# Patient Record
Sex: Male | Born: 1950 | Race: White | Hispanic: No | Marital: Married | Smoking: Former smoker
Health system: Southern US, Community
[De-identification: ages and names within clinical notes are randomized; demographics above are authoritative.]

## PROBLEM LIST (undated history)

## (undated) DIAGNOSIS — K7460 Unspecified cirrhosis of liver: Secondary | ICD-10-CM

## (undated) DIAGNOSIS — E119 Type 2 diabetes mellitus without complications: Secondary | ICD-10-CM

## (undated) DIAGNOSIS — I10 Essential (primary) hypertension: Secondary | ICD-10-CM

## (undated) DIAGNOSIS — M109 Gout, unspecified: Secondary | ICD-10-CM

## (undated) DIAGNOSIS — M5136 Other intervertebral disc degeneration, lumbar region: Secondary | ICD-10-CM

## (undated) DIAGNOSIS — M51369 Other intervertebral disc degeneration, lumbar region without mention of lumbar back pain or lower extremity pain: Secondary | ICD-10-CM

## (undated) HISTORY — PX: ADENOIDECTOMY: SUR15

## (undated) HISTORY — PX: ANKLE SURGERY: SHX546

## (undated) HISTORY — PX: CHOLECYSTECTOMY: SHX55

## (undated) HISTORY — PX: TONSILLECTOMY: SUR1361

---

## 2012-12-28 ENCOUNTER — Encounter (HOSPITAL_BASED_OUTPATIENT_CLINIC_OR_DEPARTMENT_OTHER): Payer: Self-pay | Admitting: *Deleted

## 2012-12-28 ENCOUNTER — Emergency Department (HOSPITAL_BASED_OUTPATIENT_CLINIC_OR_DEPARTMENT_OTHER)
Admission: EM | Admit: 2012-12-28 | Discharge: 2012-12-28 | Disposition: A | Discharge status: Home / Self Care | Payer: Medicare Other | Attending: Emergency Medicine | Admitting: Emergency Medicine

## 2012-12-28 ENCOUNTER — Emergency Department (HOSPITAL_BASED_OUTPATIENT_CLINIC_OR_DEPARTMENT_OTHER): Payer: Medicare Other

## 2012-12-28 DIAGNOSIS — Z794 Long term (current) use of insulin: Secondary | ICD-10-CM | POA: Insufficient documentation

## 2012-12-28 DIAGNOSIS — E119 Type 2 diabetes mellitus without complications: Secondary | ICD-10-CM | POA: Insufficient documentation

## 2012-12-28 DIAGNOSIS — M67919 Unspecified disorder of synovium and tendon, unspecified shoulder: Secondary | ICD-10-CM | POA: Insufficient documentation

## 2012-12-28 DIAGNOSIS — F17200 Nicotine dependence, unspecified, uncomplicated: Secondary | ICD-10-CM | POA: Insufficient documentation

## 2012-12-28 DIAGNOSIS — Z8639 Personal history of other endocrine, nutritional and metabolic disease: Secondary | ICD-10-CM | POA: Insufficient documentation

## 2012-12-28 DIAGNOSIS — M7542 Impingement syndrome of left shoulder: Secondary | ICD-10-CM

## 2012-12-28 DIAGNOSIS — Z79899 Other long term (current) drug therapy: Secondary | ICD-10-CM | POA: Insufficient documentation

## 2012-12-28 DIAGNOSIS — Z862 Personal history of diseases of the blood and blood-forming organs and certain disorders involving the immune mechanism: Secondary | ICD-10-CM | POA: Insufficient documentation

## 2012-12-28 DIAGNOSIS — M5137 Other intervertebral disc degeneration, lumbosacral region: Secondary | ICD-10-CM | POA: Insufficient documentation

## 2012-12-28 DIAGNOSIS — M51379 Other intervertebral disc degeneration, lumbosacral region without mention of lumbar back pain or lower extremity pain: Secondary | ICD-10-CM | POA: Insufficient documentation

## 2012-12-28 DIAGNOSIS — Z880 Allergy status to penicillin: Secondary | ICD-10-CM | POA: Insufficient documentation

## 2012-12-28 DIAGNOSIS — I10 Essential (primary) hypertension: Secondary | ICD-10-CM | POA: Insufficient documentation

## 2012-12-28 DIAGNOSIS — M719 Bursopathy, unspecified: Secondary | ICD-10-CM | POA: Insufficient documentation

## 2012-12-28 DIAGNOSIS — Z8719 Personal history of other diseases of the digestive system: Secondary | ICD-10-CM | POA: Insufficient documentation

## 2012-12-28 HISTORY — DX: Gout, unspecified: M10.9

## 2012-12-28 HISTORY — DX: Unspecified cirrhosis of liver: K74.60

## 2012-12-28 HISTORY — DX: Essential (primary) hypertension: I10

## 2012-12-28 HISTORY — DX: Other intervertebral disc degeneration, lumbar region without mention of lumbar back pain or lower extremity pain: M51.369

## 2012-12-28 HISTORY — DX: Other intervertebral disc degeneration, lumbar region: M51.36

## 2012-12-28 HISTORY — DX: Type 2 diabetes mellitus without complications: E11.9

## 2012-12-28 NOTE — ED Provider Notes (Signed)
History     CSN: [id number]  Arrival date & time 12/28/12  1425   First MD Initiated Contact with Patient 12/28/12 1426      Chief Complaint  Patient presents with  . Shoulder Pain    (Consider location/radiation/quality/duration/timing/severity/associated sxs/prior treatment) HPI Comments: Patient presents emergency department with chief complaint of left shoulder pain. He states that he has had pain similar to this in the past. He denies any known mechanism of injury. He states that he sleeps with his arm elevated above his head, and sometimes holds onto his headboard while sleeping. He is uncertain if he twisted or screen that in the night. He states that his pain is moderate to severe. He states that it hurts to raise his arm. He has not tried taking anything to alleviate his symptoms.  The history is provided by the patient. No language interpreter was used.    Past Medical History  Diagnosis Date  . Diabetes mellitus without complication   . Hypertension   . Gout   . Cirrhosis of liver not due to alcohol   . DDD (degenerative disc disease), lumbar     Past Surgical History  Procedure Laterality Date  . Ankle surgery    . Cholecystectomy    . Tonsillectomy    . Adenoidectomy      History reviewed. No pertinent family history.  History  Substance Use Topics  . Smoking status: Current Every Day Smoker  . Smokeless tobacco: Not on file  . Alcohol Use: No      Review of Systems  All other systems reviewed and are negative.    Allergies  Penicillins  Home Medications   Current Outpatient Rx  Name  Route  Sig  Dispense  Refill  . amLODipine (NORVASC) 10 MG tablet   Oral   Take 10 mg by mouth daily.         Marland Kitchen GABAPENTIN, PHN, PO   Oral   Take by mouth.         . insulin glargine (LANTUS) 100 UNIT/ML injection   Subcutaneous   Inject 35 Units into the skin at bedtime.         . Liraglutide (VICTOZA) 18 MG/3ML SOPN   Subcutaneous   Inject  into the skin.           BP 145/79  Pulse 82  Temp(Src) 98.2 F (36.8 C) (Oral)  Resp 20  Ht 6' (1.829 m)  Wt 246 lb (111.585 kg)  BMI 33.36 kg/m2  SpO2 96%  Physical Exam  Nursing note and vitals reviewed. Constitutional: He is oriented to person, place, and time. He appears well-developed and well-nourished.  HENT:  Head: Normocephalic and atraumatic.  Eyes: Conjunctivae and EOM are normal.  Neck: Normal range of motion.  Cardiovascular: Normal rate.   Pulmonary/Chest: Effort normal.  Abdominal: He exhibits no distension.  Musculoskeletal: Normal range of motion.  Left shoulder tender to palpation over the deltoid, no tenderness over the [place] or a.c. joint, no tenderness over the acromion, Hawkins-Kennedy impingement test is positive, empty can test is positive, patient unable to abduct his arm beyond 30. It is very painful with range of motion. Strength is deferred secondary to pain. No evidence of infection, no redness, no obvious deformity or abnormality.  Neurological: He is alert and oriented to person, place, and time.  Skin: Skin is dry.  Psychiatric: He has a normal mood and affect. His behavior is normal. Judgment and thought content normal.  ED Course  Procedures (including critical care time)  No results found for this or any previous visit. Dg Shoulder Left  12/28/2012  *RADIOLOGY REPORT*  Clinical Data: Shoulder pain  LEFT SHOULDER - 2+ VIEW  Comparison: None.  Findings: Three views of the left shoulder submitted.  Mild degenerative changes left AC joint.  No acute fracture or subluxation.  Small calcification is noted adjacent to humeral head suspicious for calcific tendonitis.  IMPRESSION: No acute fracture or subluxation.  Mild degenerative changes left AC joint.  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   Original Report Authenticated By: Lahoma Crocker, M.D.        1. Shoulder impingement, left       MDM  Patient with left shoulder pain. No known mechanism of  injury, however based on the patient's sleeping habits, I am suspicious of shoulder impingement syndrome, I will order a shoulder x-ray for further evaluation. Will reevaluate.  Shoulder films reveal degenerative changes of the a.c. joints and probable calcific tendinitis, will refer the patient to orthopedics.  Sling for relief of pain.  Patient can take his oxycodone at home for break through pain.  Patient is stable and ready for discharge.        Montine Circle, PA-C 12/28/12 1513

## 2012-12-28 NOTE — ED Notes (Signed)
Pt states he woke up Friday and his left shoulder was hurting. "I hang onto the headboard sometimes and this has happened before". PMS intact.

## 2012-12-29 NOTE — ED Provider Notes (Signed)
History/physical exam/procedure(s) were performed by non-physician practitioner and as supervising physician I was immediately available for consultation/collaboration. I have reviewed all notes and am in agreement with care and plan.   Shaune Pollack, MD 12/29/12 [phone]

## 2013-04-28 ENCOUNTER — Emergency Department (HOSPITAL_BASED_OUTPATIENT_CLINIC_OR_DEPARTMENT_OTHER)
Admission: EM | Admit: 2013-04-28 | Discharge: 2013-04-28 | Disposition: A | Discharge status: Home / Self Care | Payer: Medicare Other | Attending: Emergency Medicine | Admitting: Emergency Medicine

## 2013-04-28 ENCOUNTER — Emergency Department (HOSPITAL_BASED_OUTPATIENT_CLINIC_OR_DEPARTMENT_OTHER): Payer: Medicare Other

## 2013-04-28 ENCOUNTER — Encounter (HOSPITAL_BASED_OUTPATIENT_CLINIC_OR_DEPARTMENT_OTHER): Payer: Self-pay | Admitting: *Deleted

## 2013-04-28 DIAGNOSIS — Z8739 Personal history of other diseases of the musculoskeletal system and connective tissue: Secondary | ICD-10-CM | POA: Insufficient documentation

## 2013-04-28 DIAGNOSIS — F17200 Nicotine dependence, unspecified, uncomplicated: Secondary | ICD-10-CM | POA: Insufficient documentation

## 2013-04-28 DIAGNOSIS — M542 Cervicalgia: Secondary | ICD-10-CM | POA: Insufficient documentation

## 2013-04-28 DIAGNOSIS — E119 Type 2 diabetes mellitus without complications: Secondary | ICD-10-CM | POA: Insufficient documentation

## 2013-04-28 DIAGNOSIS — Z79899 Other long term (current) drug therapy: Secondary | ICD-10-CM | POA: Insufficient documentation

## 2013-04-28 DIAGNOSIS — Z880 Allergy status to penicillin: Secondary | ICD-10-CM | POA: Insufficient documentation

## 2013-04-28 DIAGNOSIS — I10 Essential (primary) hypertension: Secondary | ICD-10-CM | POA: Insufficient documentation

## 2013-04-28 DIAGNOSIS — Z794 Long term (current) use of insulin: Secondary | ICD-10-CM | POA: Insufficient documentation

## 2013-04-28 DIAGNOSIS — Z8719 Personal history of other diseases of the digestive system: Secondary | ICD-10-CM | POA: Insufficient documentation

## 2013-04-28 DIAGNOSIS — M109 Gout, unspecified: Secondary | ICD-10-CM | POA: Insufficient documentation

## 2013-04-28 MED ORDER — HYDROMORPHONE HCL PF 1 MG/ML IJ SOLN
1.0000 mg | Freq: Once | INTRAMUSCULAR | Status: AC
  Administered 2013-04-28: 1 mg via INTRAMUSCULAR
  Filled 2013-04-28: qty 1

## 2013-04-28 MED ORDER — CYCLOBENZAPRINE HCL 10 MG PO TABS: 5.0000 mg | ORAL_TABLET | Freq: Two times a day (BID) | ORAL | Status: DC | PRN

## 2013-04-28 MED ORDER — HYDROMORPHONE HCL PF 1 MG/ML IJ SOLN: 1.0000 mg | Freq: Once | INTRAMUSCULAR | Status: AC

## 2013-04-28 MED ORDER — OXYCODONE-ACETAMINOPHEN 5-325 MG PO TABS
1.0000 | ORAL_TABLET | Freq: Once | ORAL | Status: AC
  Administered 2013-04-28: 1 via ORAL
  Filled 2013-04-28 (×2): qty 1

## 2013-04-28 MED ORDER — HYDROMORPHONE HCL PF 1 MG/ML IJ SOLN
INTRAMUSCULAR | Status: AC
  Administered 2013-04-28: 1 mg via INTRAMUSCULAR
  Filled 2013-04-28: qty 1

## 2013-04-28 NOTE — ED Notes (Signed)
Pt to room 4 in w/c, able to stand and walk to bed. Pt reports neck pain since awakening Friday am, "I must have slept on it wrong..." pain is "stiff, throbbing" and has worsened since then.

## 2013-04-28 NOTE — ED Provider Notes (Signed)
CSN: [id number]     Arrival date & time 04/28/13  1013 History   First MD Initiated Contact with Patient 04/28/13 1032     Chief Complaint  Patient presents with  . Neck Pain   (Consider location/radiation/quality/duration/timing/severity/associated sxs/prior Treatment) Patient is a 62 y.o. male presenting with neck pain. The history is provided by the patient.  Neck Pain Pain location:  L side Quality:  Aching Pain radiates to:  Does not radiate Pain severity:  Moderate Pain is:  Same all the time Onset quality:  Gradual Duration:  4 days Timing:  Constant Progression:  Worsening Chronicity:  New Context: not fall and not recent injury   Relieved by:  Nothing Ineffective treatments:  Analgesics (percocet at home) Associated symptoms: no chest pain, no fever, no headaches, no numbness and no tingling     Past Medical History  Diagnosis Date  . Diabetes mellitus without complication   . Hypertension   . Gout   . Cirrhosis of liver not due to alcohol   . DDD (degenerative disc disease), lumbar    Past Surgical History  Procedure Laterality Date  . Ankle surgery    . Cholecystectomy    . Tonsillectomy    . Adenoidectomy     History reviewed. No pertinent family history. History  Substance Use Topics  . Smoking status: Current Every Day Smoker  . Smokeless tobacco: Not on file  . Alcohol Use: No    Review of Systems  Constitutional: Negative for fever.  HENT: Positive for neck pain. Negative for rhinorrhea and drooling.   Eyes: Negative for pain.  Respiratory: Negative for cough and shortness of breath.   Cardiovascular: Negative for chest pain and leg swelling.  Gastrointestinal: Negative for nausea, vomiting, abdominal pain and diarrhea.  Genitourinary: Negative for dysuria and hematuria.  Musculoskeletal: Negative for gait problem.  Skin: Negative for color change.  Neurological: Negative for tingling, numbness and headaches.  Hematological: Negative for  adenopathy.  Psychiatric/Behavioral: Negative for behavioral problems.  All other systems reviewed and are negative.    Allergies  Penicillins  Home Medications   Current Outpatient Rx  Name  Route  Sig  Dispense  Refill  . allopurinol (ZYLOPRIM) 100 MG tablet   Oral   Take 100 mg by mouth daily.         Marland Kitchen amLODipine (NORVASC) 10 MG tablet   Oral   Take 10 mg by mouth daily.         . fish oil-omega-3 fatty acids 1000 MG capsule   Oral   Take 2 g by mouth daily.         Marland Kitchen FLUoxetine (PROZAC) 20 MG capsule   Oral   Take 20 mg by mouth daily.         Marland Kitchen GABAPENTIN, PHN, PO   Oral   Take by mouth.         Marland Kitchen glipiZIDE (GLUCOTROL) 5 MG tablet   Oral   Take 5 mg by mouth.         . hydrochlorothiazide (HYDRODIURIL) 25 MG tablet   Oral   Take 25 mg by mouth daily.         . insulin glargine (LANTUS) 100 UNIT/ML injection   Subcutaneous   Inject 35 Units into the skin at bedtime.         . Liraglutide (VICTOZA) 18 MG/3ML SOPN   Subcutaneous   Inject into the skin.         . metFORMIN (  GLUCOPHAGE) 500 MG tablet   Oral   Take 500 mg by mouth 2 (two) times daily with a meal.         . metoCLOPramide (REGLAN) 5 MG tablet   Oral   Take 5 mg by mouth.         . Multiple Vitamin (MULTIVITAMIN) capsule   Oral   Take 1 capsule by mouth daily.         Marland Kitchen omeprazole (PRILOSEC) 20 MG capsule   Oral   Take 20 mg by mouth daily.         . solifenacin (VESICARE) 5 MG tablet   Oral   Take 5 mg by mouth daily.          BP 134/76  Pulse 84  Temp(Src) 97.7 F (36.5 C) (Oral)  Resp 18  Ht 6' (1.829 m)  Wt 246 lb (111.585 kg)  BMI 33.36 kg/m2  SpO2 97% Physical Exam  Nursing note and vitals reviewed. Constitutional: He is oriented to person, place, and time. He appears well-developed and well-nourished.  HENT:  Head: Normocephalic and atraumatic.  Right Ear: External ear normal.  Left Ear: External ear normal.  Nose: Nose normal.   Mouth/Throat: Oropharynx is clear and moist. No oropharyngeal exudate.  Eyes: Conjunctivae and EOM are normal. Pupils are equal, round, and reactive to light.  Neck: Normal range of motion. Neck supple.  Moderate ttp of left lateral upper neck. No midline cervical ttp.    Cardiovascular: Normal rate, regular rhythm, normal heart sounds and intact distal pulses.  Exam reveals no gallop and no friction rub.   No murmur heard. Pulmonary/Chest: Effort normal and breath sounds normal. No respiratory distress. He has no wheezes.  Abdominal: Soft. Bowel sounds are normal. He exhibits no distension. There is no tenderness. There is no rebound and no guarding.  Musculoskeletal: Normal range of motion. He exhibits no edema and no tenderness.  Neurological: He is alert and oriented to person, place, and time. He has normal strength. No sensory deficit. Coordination and gait normal.  Normal strength throughout. Normal sensation throughout.   Skin: Skin is warm and dry.  Psychiatric: He has a normal mood and affect. His behavior is normal.    ED Course  Procedures (including critical care time) Labs Review Labs Reviewed - No data to display Imaging Review Ct Cervical Spine Wo Contrast  04/28/2013   *RADIOLOGY REPORT*  Clinical Data: Lateral left neck pain.  CT CERVICAL SPINE WITHOUT CONTRAST  Technique:  Multidetector CT imaging of the cervical spine was performed. Multiplanar CT image reconstructions were also generated.  Comparison: None.  Findings: Vertebral body height is maintained.  There is trace anterolisthesis of C4 on C5, C5 on C6 and C6 on C7 due to facet degenerative change.  Intervertebral disc space height is maintained.  Failure fusion of the posterior arch of C1 is incidentally noted.  The patient does have a central protrusion at C2-3 which appears to contact the cord but the central canal appears open.  Intervertebral disc spaces are otherwise unremarkable by CT scan.  There is a polypoid  appearing lesion in the valleculae which could be a prominent lingual tonsil but cannot be definitively characterized.  Lung apices demonstrate some emphysematous change.  IMPRESSION:  1.  No acute finding or finding to explain the patient's symptoms. 2.  Polypoid appearing soft tissue focus in the vallecula may be a hypertrophied lingual tonsil but cannot be definitively characterized.  Consultation with ENT for direct  visualization is recommended. 3.  Facet arthropathy results in trace anterolisthesis from C4-C7. 4.  Central protrusion at C2-3 and may contact the cord but the central canal appears open. 5.  Emphysema.   Original Report Authenticated By: Orlean Patten, M.D.    MDM   1. Neck pain on left side    10:52 AM 62 y.o. male who presents with left lateral neck pain that was noted upon awakening 4 days ago. The patient notes the pain has progressively worsened since then. He denies any other associated symptoms including fever. He is afebrile and vital signs are stable here. No midline cervical tenderness. Suspect muscular cause but will get CT of neck to rule out occult fracture or jumped facets. Dilaudid and po Percocet for pain control.  12:39 PM: Pt feeling better on exam. Imaging non-contrib. I notified pt of incidental finding of soft tissue focus on the vallecula, will rec f/u w/ pcp for this. Pt has percocet at home, will provide muscle relaxant. I have discussed the diagnosis/risks/treatment options with the patient and believe the pt to be eligible for discharge home to follow-up with pcp this week. We also discussed returning to the ED immediately if new or worsening sx occur. We discussed the sx which are most concerning (e.g., worsening pain, weakness, numbness, fever) that necessitate immediate return. Any new prescriptions provided to the patient are listed below.  Discharge Medication List as of 04/28/2013 12:40 PM    START taking these medications   Details  cyclobenzaprine  (FLEXERIL) 10 MG tablet Take 0.5 tablets (5 mg total) by mouth 2 (two) times daily as needed for muscle spasms., Starting 04/28/2013, Until Discontinued, Print         Blanchard Kelch, MD 04/28/13 1444

## 2014-01-31 ENCOUNTER — Emergency Department (HOSPITAL_BASED_OUTPATIENT_CLINIC_OR_DEPARTMENT_OTHER): Payer: Medicare Other

## 2014-01-31 ENCOUNTER — Emergency Department (HOSPITAL_BASED_OUTPATIENT_CLINIC_OR_DEPARTMENT_OTHER)
Admission: EM | Admit: 2014-01-31 | Discharge: 2014-01-31 | Disposition: A | Discharge status: Home / Self Care | Payer: Medicare Other | Attending: Emergency Medicine | Admitting: Emergency Medicine

## 2014-01-31 ENCOUNTER — Encounter (HOSPITAL_BASED_OUTPATIENT_CLINIC_OR_DEPARTMENT_OTHER): Payer: Self-pay | Admitting: Emergency Medicine

## 2014-01-31 DIAGNOSIS — Z23 Encounter for immunization: Secondary | ICD-10-CM | POA: Insufficient documentation

## 2014-01-31 DIAGNOSIS — L02419 Cutaneous abscess of limb, unspecified: Secondary | ICD-10-CM | POA: Insufficient documentation

## 2014-01-31 DIAGNOSIS — L03119 Cellulitis of unspecified part of limb: Secondary | ICD-10-CM

## 2014-01-31 DIAGNOSIS — L0390 Cellulitis, unspecified: Secondary | ICD-10-CM

## 2014-01-31 DIAGNOSIS — L089 Local infection of the skin and subcutaneous tissue, unspecified: Secondary | ICD-10-CM

## 2014-01-31 DIAGNOSIS — F17200 Nicotine dependence, unspecified, uncomplicated: Secondary | ICD-10-CM | POA: Insufficient documentation

## 2014-01-31 DIAGNOSIS — E11628 Type 2 diabetes mellitus with other skin complications: Secondary | ICD-10-CM

## 2014-01-31 DIAGNOSIS — Z79899 Other long term (current) drug therapy: Secondary | ICD-10-CM | POA: Insufficient documentation

## 2014-01-31 DIAGNOSIS — I10 Essential (primary) hypertension: Secondary | ICD-10-CM | POA: Insufficient documentation

## 2014-01-31 DIAGNOSIS — M109 Gout, unspecified: Secondary | ICD-10-CM | POA: Insufficient documentation

## 2014-01-31 DIAGNOSIS — Z794 Long term (current) use of insulin: Secondary | ICD-10-CM | POA: Insufficient documentation

## 2014-01-31 DIAGNOSIS — E1169 Type 2 diabetes mellitus with other specified complication: Secondary | ICD-10-CM | POA: Insufficient documentation

## 2014-01-31 DIAGNOSIS — Z8719 Personal history of other diseases of the digestive system: Secondary | ICD-10-CM | POA: Insufficient documentation

## 2014-01-31 DIAGNOSIS — Z792 Long term (current) use of antibiotics: Secondary | ICD-10-CM | POA: Insufficient documentation

## 2014-01-31 DIAGNOSIS — Z880 Allergy status to penicillin: Secondary | ICD-10-CM | POA: Insufficient documentation

## 2014-01-31 DIAGNOSIS — Z8739 Personal history of other diseases of the musculoskeletal system and connective tissue: Secondary | ICD-10-CM | POA: Insufficient documentation

## 2014-01-31 DIAGNOSIS — L97509 Non-pressure chronic ulcer of other part of unspecified foot with unspecified severity: Secondary | ICD-10-CM | POA: Insufficient documentation

## 2014-01-31 LAB — CBG MONITORING, ED: Glucose-Capillary: 180 mg/dL — ABNORMAL HIGH (ref 70–99)

## 2014-01-31 MED ORDER — TETANUS-DIPHTH-ACELL PERTUSSIS 5-2.5-18.5 LF-MCG/0.5 IM SUSP
0.5000 mL | Freq: Once | INTRAMUSCULAR | Status: AC
  Administered 2014-01-31: 0.5 mL via INTRAMUSCULAR
  Filled 2014-01-31: qty 0.5

## 2014-01-31 MED ORDER — SULFAMETHOXAZOLE-TRIMETHOPRIM 800-160 MG PO TABS: 1.0000 | ORAL_TABLET | Freq: Two times a day (BID) | ORAL | Status: DC

## 2014-01-31 MED ORDER — CEPHALEXIN 500 MG PO CAPS: 500.0000 mg | ORAL_CAPSULE | Freq: Four times a day (QID) | ORAL | Status: DC

## 2014-01-31 MED ORDER — DIPHENHYDRAMINE HCL 25 MG PO CAPS: 25.0000 mg | ORAL_CAPSULE | Freq: Four times a day (QID) | ORAL | Status: DC | PRN

## 2014-01-31 NOTE — ED Notes (Signed)
Patient has a wound on his lower right leg, open and weeping. Started in November. Pt is diabetic.

## 2014-01-31 NOTE — ED Provider Notes (Signed)
CSN: [id number]     Arrival date & time 01/31/14  0734 History   First MD Initiated Contact with Patient 01/31/14 [phone]     Chief Complaint  Patient presents with  . Wound Infection     (Consider location/radiation/quality/duration/timing/severity/associated sxs/prior Treatment) HPI Comments: Pt comes in with cc of leg pain. Pt has hx of IDDM. Reports that in November, he injured himself over a metal bar. Patient has had a wound to that site since then - waxing and waning in redness and pain. Family friend advised to get the wound checked out, as there was some clear drainage, and thus patient decided to come in to the ER. No n/v/f/c. No frank pus drainage. Sugars have stayed in control.  The history is provided by the patient.    Past Medical History  Diagnosis Date  . Diabetes mellitus without complication   . Hypertension   . Gout   . Cirrhosis of liver not due to alcohol   . DDD (degenerative disc disease), lumbar    Past Surgical History  Procedure Laterality Date  . Ankle surgery    . Cholecystectomy    . Tonsillectomy    . Adenoidectomy     No family history on file. History  Substance Use Topics  . Smoking status: Current Every Day Smoker  . Smokeless tobacco: Not on file  . Alcohol Use: No    Review of Systems  Constitutional: Negative for fever and chills.  Gastrointestinal: Negative for nausea and vomiting.  Skin: Positive for wound.  Allergic/Immunologic: Positive for immunocompromised state.  Hematological: Negative for adenopathy.      Allergies  Penicillins  Home Medications   Prior to Admission medications   Medication Sig Start Date End Date Taking? Authorizing Provider  allopurinol (ZYLOPRIM) 100 MG tablet Take 100 mg by mouth daily.    Historical Provider, MD  amLODipine (NORVASC) 10 MG tablet Take 10 mg by mouth daily.    Historical Provider, MD  cephALEXin (KEFLEX) 500 MG capsule Take 1 capsule (500 mg total) by mouth 4 (four) times daily.  01/31/14   Varney Biles, MD  cyclobenzaprine (FLEXERIL) 10 MG tablet Take 0.5 tablets (5 mg total) by mouth 2 (two) times daily as needed for muscle spasms. 04/28/13   Blanchard Kelch, MD  diphenhydrAMINE (BENADRYL) 25 mg capsule Take 1 capsule (25 mg total) by mouth every 6 (six) hours as needed for itching. 01/31/14   Varney Biles, MD  fish oil-omega-3 fatty acids 1000 MG capsule Take 2 g by mouth daily.    Historical Provider, MD  FLUoxetine (PROZAC) 20 MG capsule Take 20 mg by mouth daily.    Historical Provider, MD  GABAPENTIN, PHN, PO Take by mouth.    Historical Provider, MD  glipiZIDE (GLUCOTROL) 5 MG tablet Take 5 mg by mouth.    Historical Provider, MD  hydrochlorothiazide (HYDRODIURIL) 25 MG tablet Take 25 mg by mouth daily.    Historical Provider, MD  insulin glargine (LANTUS) 100 UNIT/ML injection Inject 35 Units into the skin at bedtime.    Historical Provider, MD  Liraglutide (VICTOZA) 18 MG/3ML SOPN Inject into the skin.    Historical Provider, MD  metFORMIN (GLUCOPHAGE) 500 MG tablet Take 500 mg by mouth 2 (two) times daily with a meal.    Historical Provider, MD  metoCLOPramide (REGLAN) 5 MG tablet Take 5 mg by mouth.    Historical Provider, MD  Multiple Vitamin (MULTIVITAMIN) capsule Take 1 capsule by mouth daily.    Historical  Provider, MD  omeprazole (PRILOSEC) 20 MG capsule Take 20 mg by mouth daily.    Historical Provider, MD  solifenacin (VESICARE) 5 MG tablet Take 5 mg by mouth daily.    Historical Provider, MD  sulfamethoxazole-trimethoprim (SEPTRA DS) 800-160 MG per tablet Take 1 tablet by mouth every 12 (twelve) hours. 01/31/14   Clearnce Leja Kathrynn Humble, MD   BP 125/78  Pulse 71  Temp(Src) 98.3 F (36.8 C) (Oral)  Resp 20  Ht 5' 10"  (1.778 m)  Wt 225 lb (102.059 kg)  BMI 32.28 kg/m2  SpO2 98% Physical Exam  Nursing note and vitals reviewed. Constitutional: He is oriented to person, place, and time. He appears well-developed.  Neck: Neck supple.  Cardiovascular: Normal  rate.   Pulmonary/Chest: Effort normal.  Abdominal: Soft. He exhibits no distension. There is no tenderness.  Neurological: He is alert and oriented to person, place, and time.  Skin:  Please see the attached pictures - erythematous, non tender, no crepitus, with serosanguinous discharge.    ED Course  Procedures (including critical care time) Labs Review Labs Reviewed  CBG MONITORING, ED - Abnormal; Notable for the following:    Glucose-Capillary 180 (*)    All other components within normal limits    Imaging Review Dg Tibia/fibula Right  01/31/2014   CLINICAL DATA:  Open wound on the anterior aspect of the right lower leg.  EXAM: RIGHT TIBIA AND FIBULA - 2 VIEW  COMPARISON:  No priors.  FINDINGS: No retained radiopaque foreign body within the soft tissues. No acute displaced fracture of the right tibia or fibula. No aggressive appearing bony lesions.  IMPRESSION: 1. No acute radiographic abnormality of the right lower leg.   Electronically Signed   By: Vinnie Langton M.D.   On: 01/31/2014 08:21     EKG Interpretation None      MDM   Final diagnoses:  Diabetic foot infection  Cellulitis    Pt with foot wound as below. Erythematous, with some drainage, sero-sanguinous. Xrays shows no free air, or signs of osteo. Clinical suspicion for osteo is less. Will treat as cellulitis. PCP f/u advised.         Varney Biles, MD 01/31/14 [phone]

## 2014-01-31 NOTE — Discharge Instructions (Signed)
Please see your primary care doctor within a week to ensure you are healing well. Finish the antibiotic course.  Please return to the ER if your symptoms worsen; you have increased pain, fevers, chills, inability to keep any medications down, confusion and pus drainage. Otherwise see the outpatient doctor as requested.   Cellulitis Cellulitis is an infection of the skin and the tissue beneath it. The infected area is usually red and tender. Cellulitis occurs most often in the arms and lower legs.  CAUSES  Cellulitis is caused by bacteria that enter the skin through cracks or cuts in the skin. The most common types of bacteria that cause cellulitis are Staphylococcus and Streptococcus. SYMPTOMS   Redness and warmth.  Swelling.  Tenderness or pain.  Fever. DIAGNOSIS  Your caregiver can usually determine what is wrong based on a physical exam. Blood tests may also be done. TREATMENT  Treatment usually involves taking an antibiotic medicine. HOME CARE INSTRUCTIONS   Take your antibiotics as directed. Finish them even if you start to feel better.  Keep the infected arm or leg elevated to reduce swelling.  Apply a warm cloth to the affected area up to 4 times per day to relieve pain.  Only take over-the-counter or prescription medicines for pain, discomfort, or fever as directed by your caregiver.  Keep all follow-up appointments as directed by your caregiver. SEEK MEDICAL CARE IF:   You notice red streaks coming from the infected area.  Your red area gets larger or turns dark in color.  Your bone or joint underneath the infected area becomes painful after the skin has healed.  Your infection returns in the same area or another area.  You notice a swollen bump in the infected area.  You develop new symptoms. SEEK IMMEDIATE MEDICAL CARE IF:   You have a fever.  You feel very sleepy.  You develop vomiting or diarrhea.  You have a general ill feeling (malaise) with  muscle aches and pains. MAKE SURE YOU:   Understand these instructions.  Will watch your condition.  Will get help right away if you are not doing well or get worse. Document Released: 05/23/2005 Document Revised: 02/12/2012 Document Reviewed: 10/29/2011 [facility] Patient Information 2014 [place].  Diabetes and Foot Care Diabetes may cause you to have problems because of poor blood supply (circulation) to your feet and legs. This may cause the skin on your feet to become thinner, break easier, and heal more slowly. Your skin may become dry, and the skin may peel and crack. You may also have nerve damage in your legs and feet causing decreased feeling in them. You may not notice minor injuries to your feet that could lead to infections or more serious problems. Taking care of your feet is one of the most important things you can do for yourself.  HOME CARE INSTRUCTIONS  Wear shoes at all times, even in the house. Do not go barefoot. Bare feet are easily injured.  Check your feet daily for blisters, cuts, and redness. If you cannot see the bottom of your feet, use a mirror or ask someone for help.  Wash your feet with warm water (do not use hot water) and mild soap. Then pat your feet and the areas between your toes until they are completely dry. Do not soak your feet as this can dry your skin.  Apply a moisturizing lotion or petroleum jelly (that does not contain alcohol and is unscented) to the skin on your feet and  to dry, brittle toenails. Do not apply lotion between your toes.  Trim your toenails straight across. Do not dig under them or around the cuticle. File the edges of your nails with an emery board or nail file.  Do not cut corns or calluses or try to remove them with medicine.  Wear clean socks or stockings every day. Make sure they are not too tight. Do not wear knee-high stockings since they may decrease blood flow to your legs.  Wear shoes that fit properly and have  enough cushioning. To break in new shoes, wear them for just a few hours a day. This prevents you from injuring your feet. Always look in your shoes before you put them on to be sure there are no objects inside.  Do not cross your legs. This may decrease the blood flow to your feet.  If you find a minor scrape, cut, or break in the skin on your feet, keep it and the skin around it clean and dry. These areas may be cleansed with mild soap and water. Do not cleanse the area with peroxide, alcohol, or iodine.  When you remove an adhesive bandage, be sure not to damage the skin around it.  If you have a wound, look at it several times a day to make sure it is healing.  Do not use heating pads or hot water bottles. They may burn your skin. If you have lost feeling in your feet or legs, you may not know it is happening until it is too late.  Make sure your health care provider performs a complete foot exam at least annually or more often if you have foot problems. Report any cuts, sores, or bruises to your health care provider immediately. SEEK MEDICAL CARE IF:   You have an injury that is not healing.  You have cuts or breaks in the skin.  You have an ingrown nail.  You notice redness on your legs or feet.  You feel burning or tingling in your legs or feet.  You have pain or cramps in your legs and feet.  Your legs or feet are numb.  Your feet always feel cold. SEEK IMMEDIATE MEDICAL CARE IF:   There is increasing redness, swelling, or pain in or around a wound.  There is a red line that goes up your leg.  Pus is coming from a wound.  You develop a fever or as directed by your health care provider.  You notice a bad smell coming from an ulcer or wound. Document Released: 08/10/2000 Document Revised: 04/15/2013 Document Reviewed: 01/20/2013 [facility] Patient Information 2014 [place].

## 2014-10-05 ENCOUNTER — Emergency Department (HOSPITAL_BASED_OUTPATIENT_CLINIC_OR_DEPARTMENT_OTHER)
Admission: EM | Admit: 2014-10-05 | Discharge: 2014-10-05 | Disposition: A | Discharge status: Home / Self Care | Payer: Medicare HMO | Attending: Emergency Medicine | Admitting: Emergency Medicine

## 2014-10-05 ENCOUNTER — Encounter (HOSPITAL_BASED_OUTPATIENT_CLINIC_OR_DEPARTMENT_OTHER): Payer: Self-pay

## 2014-10-05 ENCOUNTER — Emergency Department (HOSPITAL_BASED_OUTPATIENT_CLINIC_OR_DEPARTMENT_OTHER): Payer: Medicare HMO

## 2014-10-05 DIAGNOSIS — Z79899 Other long term (current) drug therapy: Secondary | ICD-10-CM | POA: Insufficient documentation

## 2014-10-05 DIAGNOSIS — R05 Cough: Secondary | ICD-10-CM

## 2014-10-05 DIAGNOSIS — J40 Bronchitis, not specified as acute or chronic: Secondary | ICD-10-CM

## 2014-10-05 DIAGNOSIS — E119 Type 2 diabetes mellitus without complications: Secondary | ICD-10-CM | POA: Diagnosis not present

## 2014-10-05 DIAGNOSIS — Z87891 Personal history of nicotine dependence: Secondary | ICD-10-CM | POA: Diagnosis not present

## 2014-10-05 DIAGNOSIS — R945 Abnormal results of liver function studies: Secondary | ICD-10-CM | POA: Insufficient documentation

## 2014-10-05 DIAGNOSIS — Z8739 Personal history of other diseases of the musculoskeletal system and connective tissue: Secondary | ICD-10-CM | POA: Insufficient documentation

## 2014-10-05 DIAGNOSIS — K7030 Alcoholic cirrhosis of liver without ascites: Secondary | ICD-10-CM | POA: Insufficient documentation

## 2014-10-05 DIAGNOSIS — M109 Gout, unspecified: Secondary | ICD-10-CM | POA: Diagnosis not present

## 2014-10-05 DIAGNOSIS — R7989 Other specified abnormal findings of blood chemistry: Secondary | ICD-10-CM

## 2014-10-05 DIAGNOSIS — R059 Cough, unspecified: Secondary | ICD-10-CM

## 2014-10-05 DIAGNOSIS — Z794 Long term (current) use of insulin: Secondary | ICD-10-CM | POA: Diagnosis not present

## 2014-10-05 DIAGNOSIS — Z880 Allergy status to penicillin: Secondary | ICD-10-CM | POA: Diagnosis not present

## 2014-10-05 DIAGNOSIS — K7460 Unspecified cirrhosis of liver: Secondary | ICD-10-CM

## 2014-10-05 DIAGNOSIS — J209 Acute bronchitis, unspecified: Secondary | ICD-10-CM | POA: Insufficient documentation

## 2014-10-05 DIAGNOSIS — I10 Essential (primary) hypertension: Secondary | ICD-10-CM | POA: Insufficient documentation

## 2014-10-05 LAB — CBC WITH DIFFERENTIAL/PLATELET
Basophils Absolute: 0.1 10*3/uL (ref 0.0–0.1)
Basophils Relative: 1 % (ref 0–1)
Eosinophils Absolute: 0.5 10*3/uL (ref 0.0–0.7)
Eosinophils Relative: 7 % — ABNORMAL HIGH (ref 0–5)
HCT: 43.7 % (ref 39.0–52.0)
HEMOGLOBIN: 14.7 g/dL (ref 13.0–17.0)
LYMPHS ABS: 2.2 10*3/uL (ref 0.7–4.0)
LYMPHS PCT: 31 % (ref 12–46)
MCH: 30.8 pg (ref 26.0–34.0)
MCHC: 33.6 g/dL (ref 30.0–36.0)
MCV: 91.4 fL (ref 78.0–100.0)
MONOS PCT: 9 % (ref 3–12)
Monocytes Absolute: 0.7 10*3/uL (ref 0.1–1.0)
NEUTROS ABS: 3.7 10*3/uL (ref 1.7–7.7)
NEUTROS PCT: 52 % (ref 43–77)
Platelets: 155 10*3/uL (ref 150–400)
RBC: 4.78 MIL/uL (ref 4.22–5.81)
RDW: 12.7 % (ref 11.5–15.5)
WBC: 7.1 10*3/uL (ref 4.0–10.5)

## 2014-10-05 LAB — COMPREHENSIVE METABOLIC PANEL
ALBUMIN: 3.6 g/dL (ref 3.5–5.2)
ALK PHOS: 124 U/L — AB (ref 39–117)
ALT: 77 U/L — ABNORMAL HIGH (ref 0–53)
ANION GAP: 6 (ref 5–15)
AST: 70 U/L — ABNORMAL HIGH (ref 0–37)
BUN: 12 mg/dL (ref 6–23)
CALCIUM: 9.1 mg/dL (ref 8.4–10.5)
CHLORIDE: 102 mmol/L (ref 96–112)
CO2: 24 mmol/L (ref 19–32)
Creatinine, Ser: 0.84 mg/dL (ref 0.50–1.35)
GFR calc non Af Amer: >90 mL/min (ref >90)
GLUCOSE: 138 mg/dL — AB (ref 70–99)
Potassium: 3.6 mmol/L (ref 3.5–5.1)
SODIUM: 132 mmol/L — AB (ref 135–145)
Total Bilirubin: 0.5 mg/dL (ref 0.3–1.2)
Total Protein: 7.5 g/dL (ref 6.0–8.3)

## 2014-10-05 LAB — RAPID STREP SCREEN (MED CTR MEBANE ONLY): Streptococcus, Group A Screen (Direct): NEGATIVE

## 2014-10-05 MED ORDER — AZITHROMYCIN 250 MG PO TABS: 250.0000 mg | ORAL_TABLET | Freq: Every day | ORAL | Status: DC

## 2014-10-05 NOTE — ED Provider Notes (Signed)
CSN: [id number]     Arrival date & time 10/05/14  1306 History   First MD Initiated Contact with Patient 10/05/14 1321     Chief Complaint  Patient presents with  . URI      HPI Patient presents with sore throat, body ache, chills and cough for the last 2-3 days.  Denies dysuria.  Denies nausea vomiting or diarrhea.  Has appointment to see primary care doctor later this week. Past Medical History  Diagnosis Date  . Diabetes mellitus without complication   . Hypertension   . Gout   . Cirrhosis of liver not due to alcohol   . DDD (degenerative disc disease), lumbar    Past Surgical History  Procedure Laterality Date  . Ankle surgery    . Cholecystectomy    . Tonsillectomy    . Adenoidectomy     No family history on file. History  Substance Use Topics  . Smoking status: Former Research scientist (life sciences)  . Smokeless tobacco: Not on file  . Alcohol Use: No    Review of Systems  All other systems reviewed and are negative  Allergies  Penicillins  Home Medications   Prior to Admission medications   Medication Sig Start Date End Date Taking? Authorizing Provider  allopurinol (ZYLOPRIM) 100 MG tablet Take 100 mg by mouth daily.    Historical Provider, MD  amLODipine (NORVASC) 10 MG tablet Take 10 mg by mouth daily.    Historical Provider, MD  azithromycin (ZITHROMAX) 250 MG tablet Take 1 tablet (250 mg total) by mouth daily. Take first 2 tablets together, then 1 every day until finished. 10/05/14   Dot Lanes, MD  fish oil-omega-3 fatty acids 1000 MG capsule Take 2 g by mouth daily.    Historical Provider, MD  FLUoxetine (PROZAC) 20 MG capsule Take 20 mg by mouth daily.    Historical Provider, MD  GABAPENTIN, PHN, PO Take by mouth.    Historical Provider, MD  glipiZIDE (GLUCOTROL) 5 MG tablet Take 5 mg by mouth.    Historical Provider, MD  hydrochlorothiazide (HYDRODIURIL) 25 MG tablet Take 25 mg by mouth daily.    Historical Provider, MD  insulin glargine (LANTUS) 100 UNIT/ML injection  Inject 35 Units into the skin at bedtime.    Historical Provider, MD  Liraglutide (VICTOZA) 18 MG/3ML SOPN Inject into the skin.    Historical Provider, MD  metFORMIN (GLUCOPHAGE) 500 MG tablet Take 500 mg by mouth 2 (two) times daily with a meal.    Historical Provider, MD  metoCLOPramide (REGLAN) 5 MG tablet Take 5 mg by mouth.    Historical Provider, MD  Multiple Vitamin (MULTIVITAMIN) capsule Take 1 capsule by mouth daily.    Historical Provider, MD  omeprazole (PRILOSEC) 20 MG capsule Take 20 mg by mouth daily.    Historical Provider, MD  solifenacin (VESICARE) 5 MG tablet Take 5 mg by mouth daily.    Historical Provider, MD   BP 134/81 mmHg  Pulse 85  Temp(Src) 98.3 F (36.8 C) (Oral)  Resp 20  Ht 6' (1.829 m)  Wt 233 lb (105.688 kg)  BMI 31.59 kg/m2  SpO2 97% Physical Exam Physical Exam  Nursing note and vitals reviewed. Constitutional: He is oriented to person, place, and time. He appears well-developed and well-nourished. No distress.  HENT:  Head: Normocephalic and atraumatic.  Eyes: Pupils are equal, round, and reactive to light.  Neck: Normal range of motion.  Cardiovascular: Normal rate and intact distal pulses.   Pulmonary/Chest: No respiratory  distress.  Abdominal: Normal appearance. He exhibits no distension.  Musculoskeletal: Normal range of motion.  Neurological: He is alert and oriented to person, place, and time. No cranial nerve deficit.  Skin: Skin is warm and dry. No rash noted.  Psychiatric: He has a normal mood and affect. His behavior is normal.   ED Course  Procedures (including critical care time) Labs Review Labs Reviewed  CBC WITH DIFFERENTIAL/PLATELET - Abnormal; Notable for the following:    Eosinophils Relative 7 (*)    All other components within normal limits  COMPREHENSIVE METABOLIC PANEL - Abnormal; Notable for the following:    Sodium 132 (*)    Glucose, Bld 138 (*)    AST 70 (*)    ALT 77 (*)    Alkaline Phosphatase 124 (*)    All  other components within normal limits  RAPID STREP SCREEN  CULTURE, GROUP A STREP    Imaging Review Dg Chest 2 View  10/05/2014   CLINICAL DATA:  Cough and shortness of breath.  EXAM: CHEST  2 VIEW  COMPARISON:  None.  FINDINGS: Heart size and pulmonary vascularity are normal and the lungs are clear except for slight peribronchial thickening. No effusions. No osseous abnormality.  IMPRESSION: Slight bronchitic changes.   Electronically Signed   By: Lorriane Shire M.D.   On: 10/05/2014 14:28      MDM   Final diagnoses:  Cough  Bronchitis  Elevated LFTs  Cirrhosis of liver without ascites, unspecified hepatic cirrhosis type        Dot Lanes, MD 10/05/14 [phone]

## 2014-10-05 NOTE — ED Notes (Signed)
MD at bedside discussing results with patient and family. 

## 2014-10-05 NOTE — ED Notes (Signed)
MD at bedside. 

## 2014-10-05 NOTE — ED Notes (Addendum)
C/o HA, eye pain, sore throat, chills x 2 weeks

## 2014-10-05 NOTE — ED Notes (Signed)
Patient transported to X-ray 

## 2014-10-05 NOTE — Discharge Instructions (Signed)
Upper Respiratory Infection, Adult An upper respiratory infection (URI) is also known as the common cold. It is often caused by a type of germ (virus). Colds are easily spread (contagious). You can pass it to others by kissing, coughing, sneezing, or drinking out of the same glass. Usually, you get better in 1 or 2 weeks.  HOME CARE   Only take medicine as told by your doctor.  Use a warm mist humidifier or breathe in steam from a hot shower.  Drink enough water and fluids to keep your pee (urine) clear or pale yellow.  Get plenty of rest.  Return to work when your temperature is back to normal or as told by your doctor. You may use a face mask and wash your hands to stop your cold from spreading. GET HELP RIGHT AWAY IF:   After the first few days, you feel you are getting worse.  You have questions about your medicine.  You have chills, shortness of breath, or brown or red spit (mucus).  You have yellow or brown snot (nasal discharge) or pain in the face, especially when you bend forward.  You have a fever, puffy (swollen) neck, pain when you swallow, or white spots in the back of your throat.  You have a bad headache, ear pain, sinus pain, or chest pain.  You have a high-pitched whistling sound when you breathe in and out (wheezing).  You have a lasting cough or cough up blood.  You have sore muscles or a stiff neck. MAKE SURE YOU:   Understand these instructions.  Will watch your condition.  Will get help right away if you are not doing well or get worse. Document Released: 01/30/2008 Document Revised: 11/05/2011 Document Reviewed: 11/18/2013 [employer] Patient Information 2015 [employer]. This information is not intended to replace advice given to you by your health care provider. Make sure you discuss any questions you have with your health care provider.  

## 2014-10-08 LAB — CULTURE, GROUP A STREP

## 2014-12-29 ENCOUNTER — Encounter (HOSPITAL_BASED_OUTPATIENT_CLINIC_OR_DEPARTMENT_OTHER): Payer: Self-pay | Admitting: *Deleted

## 2014-12-29 ENCOUNTER — Emergency Department (HOSPITAL_BASED_OUTPATIENT_CLINIC_OR_DEPARTMENT_OTHER)
Admission: EM | Admit: 2014-12-29 | Discharge: 2014-12-30 | Disposition: A | Discharge status: Home / Self Care | Payer: Medicare HMO | Attending: Emergency Medicine | Admitting: Emergency Medicine

## 2014-12-29 ENCOUNTER — Emergency Department (HOSPITAL_BASED_OUTPATIENT_CLINIC_OR_DEPARTMENT_OTHER): Payer: Medicare HMO

## 2014-12-29 DIAGNOSIS — M109 Gout, unspecified: Secondary | ICD-10-CM | POA: Diagnosis not present

## 2014-12-29 DIAGNOSIS — Z880 Allergy status to penicillin: Secondary | ICD-10-CM | POA: Diagnosis not present

## 2014-12-29 DIAGNOSIS — Z794 Long term (current) use of insulin: Secondary | ICD-10-CM | POA: Diagnosis not present

## 2014-12-29 DIAGNOSIS — Z87891 Personal history of nicotine dependence: Secondary | ICD-10-CM | POA: Diagnosis not present

## 2014-12-29 DIAGNOSIS — Z79899 Other long term (current) drug therapy: Secondary | ICD-10-CM | POA: Insufficient documentation

## 2014-12-29 DIAGNOSIS — E119 Type 2 diabetes mellitus without complications: Secondary | ICD-10-CM | POA: Insufficient documentation

## 2014-12-29 DIAGNOSIS — R110 Nausea: Secondary | ICD-10-CM | POA: Insufficient documentation

## 2014-12-29 DIAGNOSIS — Z792 Long term (current) use of antibiotics: Secondary | ICD-10-CM | POA: Insufficient documentation

## 2014-12-29 DIAGNOSIS — R079 Chest pain, unspecified: Secondary | ICD-10-CM | POA: Diagnosis not present

## 2014-12-29 DIAGNOSIS — I10 Essential (primary) hypertension: Secondary | ICD-10-CM | POA: Insufficient documentation

## 2014-12-29 LAB — BASIC METABOLIC PANEL
Anion gap: 9 (ref 5–15)
BUN: 14 mg/dL (ref 6–20)
CHLORIDE: 101 mmol/L (ref 101–111)
CO2: 27 mmol/L (ref 22–32)
CREATININE: 1.06 mg/dL (ref 0.61–1.24)
Calcium: 9.6 mg/dL (ref 8.9–10.3)
GFR calc non Af Amer: >60 mL/min (ref >60)
Glucose, Bld: 217 mg/dL — ABNORMAL HIGH (ref 70–99)
POTASSIUM: 4.2 mmol/L (ref 3.5–5.1)
Sodium: 137 mmol/L (ref 135–145)

## 2014-12-29 LAB — CBC WITH DIFFERENTIAL/PLATELET
BASOS PCT: 1 % (ref 0–1)
Basophils Absolute: 0.1 10*3/uL (ref 0.0–0.1)
EOS PCT: 6 % — AB (ref 0–5)
Eosinophils Absolute: 0.3 10*3/uL (ref 0.0–0.7)
HCT: 43.3 % (ref 39.0–52.0)
HEMOGLOBIN: 14.8 g/dL (ref 13.0–17.0)
LYMPHS ABS: 2.3 10*3/uL (ref 0.7–4.0)
Lymphocytes Relative: 39 % (ref 12–46)
MCH: 31.4 pg (ref 26.0–34.0)
MCHC: 34.2 g/dL (ref 30.0–36.0)
MCV: 91.7 fL (ref 78.0–100.0)
MONO ABS: 0.6 10*3/uL (ref 0.1–1.0)
MONOS PCT: 11 % (ref 3–12)
NEUTROS ABS: 2.7 10*3/uL (ref 1.7–7.7)
NEUTROS PCT: 43 % (ref 43–77)
Platelets: 142 10*3/uL — ABNORMAL LOW (ref 150–400)
RBC: 4.72 MIL/uL (ref 4.22–5.81)
RDW: 13.3 % (ref 11.5–15.5)
WBC: 6 10*3/uL (ref 4.0–10.5)

## 2014-12-29 LAB — TROPONIN I

## 2014-12-29 MED ORDER — ASPIRIN 325 MG PO TABS
325.0000 mg | ORAL_TABLET | Freq: Once | ORAL | Status: AC
  Administered 2014-12-29: 325 mg via ORAL
  Filled 2014-12-29: qty 1

## 2014-12-29 NOTE — ED Notes (Signed)
MD at bedside. 

## 2014-12-29 NOTE — ED Provider Notes (Signed)
CSN: [id number]     Arrival date & time 12/29/14  1900 History  This chart was scribed for Brad Beers, MD by Delphia Grates, ED Scribe. This patient was seen in room MH07/MH07 and the patient's care was started at 9:28 PM.     Chief Complaint  Patient presents with  . Chest Pain    Patient is a 64 y.o. male presenting with chest pain. The history is provided by the patient. No language interpreter was used.  Chest Pain Pain quality: pressure   Pain radiates to:  Does not radiate Pain radiates to the back: no   Pain severity:  Moderate Onset quality:  Sudden Timing:  Intermittent Progression:  Unchanged Chronicity:  New Context: eating   Relieved by:  Nothing Worsened by:  Nothing tried Ineffective treatments:  Antacids Associated symptoms: nausea   Associated symptoms: no diaphoresis and not vomiting   Risk factors: diabetes mellitus and hypertension      HPI Comments: Brad [name] is a 64 y.o. male, with history of DM and HTN, who presents to the Emergency Department complaining of sudden onset, intermittent central chest pain described at pressure that began this morning. Patient states the pain began this morning after eating breakfast and subsided as the day progressed. He reports the pain subsequently returned after eating dinner approximately 4.5 hours ago at 1700. There is associated nausea. Patient has taken Tums without relief and denies any chest pain with exertion. He reports history of prior similar episode that occurred 2 days ago, but reports this resolved after taking Tums. Patient takes 81mg  Aspirin daily. He denies diaphoresis.    Past Medical History  Diagnosis Date  . Diabetes mellitus without complication   . Hypertension   . Gout   . Cirrhosis of liver not due to alcohol   . DDD (degenerative disc disease), lumbar    Past Surgical History  Procedure Laterality Date  . Ankle surgery    . Cholecystectomy    . Tonsillectomy    . Adenoidectomy      History reviewed. No pertinent family history. History  Substance Use Topics  . Smoking status: Former Research scientist (life sciences)  . Smokeless tobacco: Not on file  . Alcohol Use: No    Review of Systems  Constitutional: Negative for diaphoresis.  Cardiovascular: Positive for chest pain.  Gastrointestinal: Positive for nausea. Negative for vomiting.  All other systems reviewed and are negative.     Allergies  Penicillins  Home Medications   Prior to Admission medications   Medication Sig Start Date End Date Taking? Authorizing Provider  allopurinol (ZYLOPRIM) 100 MG tablet Take 100 mg by mouth daily.    Historical Provider, MD  amLODipine (NORVASC) 10 MG tablet Take 10 mg by mouth daily.    Historical Provider, MD  azithromycin (ZITHROMAX) 250 MG tablet Take 1 tablet (250 mg total) by mouth daily. Take first 2 tablets together, then 1 every day until finished. 10/05/14   Leonard Schwartz, MD  fish oil-omega-3 fatty acids 1000 MG capsule Take 2 g by mouth daily.    Historical Provider, MD  FLUoxetine (PROZAC) 20 MG capsule Take 20 mg by mouth daily.    Historical Provider, MD  GABAPENTIN, PHN, PO Take by mouth.    Historical Provider, MD  glipiZIDE (GLUCOTROL) 5 MG tablet Take 5 mg by mouth.    Historical Provider, MD  hydrochlorothiazide (HYDRODIURIL) 25 MG tablet Take 25 mg by mouth daily.    Historical Provider, MD  insulin glargine (LANTUS) 100  UNIT/ML injection Inject 35 Units into the skin at bedtime.    Historical Provider, MD  Liraglutide (VICTOZA) 18 MG/3ML SOPN Inject into the skin.    Historical Provider, MD  metFORMIN (GLUCOPHAGE) 500 MG tablet Take 500 mg by mouth 2 (two) times daily with a meal.    Historical Provider, MD  metoCLOPramide (REGLAN) 5 MG tablet Take 5 mg by mouth.    Historical Provider, MD  Multiple Vitamin (MULTIVITAMIN) capsule Take 1 capsule by mouth daily.    Historical Provider, MD  omeprazole (PRILOSEC) 20 MG capsule Take 20 mg by mouth daily.    Historical Provider,  MD  solifenacin (VESICARE) 5 MG tablet Take 5 mg by mouth daily.    Historical Provider, MD   Triage Vitals: BP 114/73 mmHg  Pulse 64  Temp(Src) 98.4 F (36.9 C) (Oral)  Resp 12  SpO2 93% Vitals reviewed Physical Exam  Physical Examination: General appearance - alert, well appearing, and in no distress Mental status - alert, oriented to person, place, and time Eyes - no conjunctival injection no scleral icterus Mouth - mucous membranes moist, pharynx normal without lesions Chest - clear to auscultation, no wheezes, rales or rhonchi, symmetric air entry Heart - normal rate, regular rhythm, normal S1, S2, no murmurs, rubs, clicks or gallops Abdomen - soft, nontender, nondistended, no masses or organomegaly Extremities - peripheral pulses normal, no pedal edema, no clubbing or cyanosis Skin - normal coloration and turgor, no rashes  ED Course  Procedures (including critical care time)  DIAGNOSTIC STUDIES: Oxygen Saturation is 93% on room air, adequate by my interpretation.    COORDINATION OF CARE: At 2135 Discussed treatment plan with patient which includes aspirin. Patient agrees.   .now  Labs Review Labs Reviewed  CBC WITH DIFFERENTIAL/PLATELET - Abnormal; Notable for the following:    Platelets 142 (*)    Eosinophils Relative 6 (*)    All other components within normal limits  BASIC METABOLIC PANEL - Abnormal; Notable for the following:    Glucose, Bld 217 (*)    All other components within normal limits  TROPONIN I  TROPONIN I    Imaging Review Dg Chest 2 View  12/29/2014   CLINICAL DATA:  Chest pain and shortness of Breath  EXAM: CHEST  2 VIEW  COMPARISON:  10/05/2014  FINDINGS: Cardiac shadow is within normal limits. Mild bibasilar atelectasis is noted without focal confluent infiltrate. No sizable effusion is noted. No acute bony abnormality is seen.  IMPRESSION: Mild bibasilar atelectasis.   Electronically Signed   By: Inez Catalina M.D.   On: 12/29/2014 21:30      EKG Interpretation   Date/Time:  Wednesday Dec 29 2014 19:09:09 EDT Ventricular Rate:  70 PR Interval:  166 QRS Duration: 102 QT Interval:  412 QTC Calculation: 444 R Axis:   48 Text Interpretation:  Sinus rhythm with occasional Premature ventricular  complexes Otherwise normal ECG No old tracing to compare Confirmed by  [facility]  MD, [place] [phone]) on 12/29/2014 9:25:38 PM      MDM   Final diagnoses:  Chest pain, unspecified chest pain type     Pt with a heart score of 2.  Low risk of ACS. No risk factors for PE.  Pain started approx 5pm this evening.  EKG is reassuring.  Initial blood work and troponin are negative.  Awaiting second troponin to further rule out ACS in this patient.  If this is negative, pt will be ready for discharge, followup with PMD.  I personally performed the services described in this documentation, which was scribed in my presence. The recorded information has been reviewed and is accurate.    Brad Beers, MD 12/31/14 2035

## 2014-12-29 NOTE — ED Notes (Signed)
Pt c/o chest pain in center of chest pain x 4 hrs SOb , nausea

## 2014-12-30 LAB — TROPONIN I: Troponin I: <0.03 ng/mL (ref <0.031)

## 2014-12-30 NOTE — Discharge Instructions (Signed)
Return to the ED with any concerns including increased pain, difficulty breathing, fainting, leg swelling, decrease level of alertness/lethargy, or any other alarming symptoms

## 2014-12-30 NOTE — ED Notes (Signed)
MD at bedside discussing test results and dispo plan of care. 

## 2015-06-12 DIAGNOSIS — A630 Anogenital (venereal) warts: Secondary | ICD-10-CM | POA: Insufficient documentation

## 2015-11-09 DIAGNOSIS — Z794 Long term (current) use of insulin: Secondary | ICD-10-CM | POA: Insufficient documentation

## 2015-11-09 DIAGNOSIS — I10 Essential (primary) hypertension: Secondary | ICD-10-CM | POA: Insufficient documentation

## 2015-11-09 DIAGNOSIS — IMO0001 Reserved for inherently not codable concepts without codable children: Secondary | ICD-10-CM | POA: Insufficient documentation

## 2015-11-09 DIAGNOSIS — E1165 Type 2 diabetes mellitus with hyperglycemia: Secondary | ICD-10-CM

## 2015-11-09 DIAGNOSIS — K7460 Unspecified cirrhosis of liver: Secondary | ICD-10-CM | POA: Insufficient documentation

## 2015-11-09 DIAGNOSIS — G4733 Obstructive sleep apnea (adult) (pediatric): Secondary | ICD-10-CM | POA: Insufficient documentation

## 2015-11-09 DIAGNOSIS — E785 Hyperlipidemia, unspecified: Secondary | ICD-10-CM | POA: Insufficient documentation

## 2017-04-28 ENCOUNTER — Encounter (HOSPITAL_BASED_OUTPATIENT_CLINIC_OR_DEPARTMENT_OTHER): Payer: Self-pay | Admitting: Emergency Medicine

## 2017-04-28 ENCOUNTER — Emergency Department (HOSPITAL_BASED_OUTPATIENT_CLINIC_OR_DEPARTMENT_OTHER): Payer: Medicare HMO

## 2017-04-28 ENCOUNTER — Emergency Department (HOSPITAL_BASED_OUTPATIENT_CLINIC_OR_DEPARTMENT_OTHER)
Admission: EM | Admit: 2017-04-28 | Discharge: 2017-04-28 | Disposition: A | Discharge status: Home / Self Care | Payer: Medicare HMO | Attending: Emergency Medicine | Admitting: Emergency Medicine

## 2017-04-28 DIAGNOSIS — Z79899 Other long term (current) drug therapy: Secondary | ICD-10-CM | POA: Insufficient documentation

## 2017-04-28 DIAGNOSIS — R188 Other ascites: Secondary | ICD-10-CM

## 2017-04-28 DIAGNOSIS — Z7984 Long term (current) use of oral hypoglycemic drugs: Secondary | ICD-10-CM | POA: Diagnosis not present

## 2017-04-28 DIAGNOSIS — I10 Essential (primary) hypertension: Secondary | ICD-10-CM | POA: Insufficient documentation

## 2017-04-28 DIAGNOSIS — Z87891 Personal history of nicotine dependence: Secondary | ICD-10-CM | POA: Insufficient documentation

## 2017-04-28 DIAGNOSIS — R1013 Epigastric pain: Secondary | ICD-10-CM | POA: Diagnosis not present

## 2017-04-28 DIAGNOSIS — E119 Type 2 diabetes mellitus without complications: Secondary | ICD-10-CM | POA: Diagnosis not present

## 2017-04-28 DIAGNOSIS — R1084 Generalized abdominal pain: Secondary | ICD-10-CM | POA: Diagnosis present

## 2017-04-28 LAB — URINALYSIS, ROUTINE W REFLEX MICROSCOPIC
BILIRUBIN URINE: NEGATIVE
Hgb urine dipstick: NEGATIVE
Ketones, ur: 15 mg/dL — AB
LEUKOCYTES UA: NEGATIVE
Nitrite: NEGATIVE
Protein, ur: NEGATIVE mg/dL
Specific Gravity, Urine: 1.015 (ref 1.005–1.030)
pH: 6.5 (ref 5.0–8.0)

## 2017-04-28 LAB — COMPREHENSIVE METABOLIC PANEL
ALK PHOS: 108 U/L (ref 38–126)
ALT: 40 U/L (ref 17–63)
AST: 60 U/L — AB (ref 15–41)
Albumin: 3 g/dL — ABNORMAL LOW (ref 3.5–5.0)
Anion gap: 10 (ref 5–15)
BILIRUBIN TOTAL: 0.7 mg/dL (ref 0.3–1.2)
BUN: 11 mg/dL (ref 6–20)
CO2: 22 mmol/L (ref 22–32)
Calcium: 9 mg/dL (ref 8.9–10.3)
Chloride: 106 mmol/L (ref 101–111)
Creatinine, Ser: 0.82 mg/dL (ref 0.61–1.24)
GFR calc Af Amer: >60 mL/min (ref >60)
Glucose, Bld: 137 mg/dL — ABNORMAL HIGH (ref 65–99)
Potassium: 3 mmol/L — ABNORMAL LOW (ref 3.5–5.1)
Sodium: 138 mmol/L (ref 135–145)
TOTAL PROTEIN: 6.8 g/dL (ref 6.5–8.1)

## 2017-04-28 LAB — CBC
HCT: 43.2 % (ref 39.0–52.0)
Hemoglobin: 14 g/dL (ref 13.0–17.0)
MCH: 27.7 pg (ref 26.0–34.0)
MCHC: 32.4 g/dL (ref 30.0–36.0)
MCV: 85.4 fL (ref 78.0–100.0)
Platelets: 153 10*3/uL (ref 150–400)
RBC: 5.06 MIL/uL (ref 4.22–5.81)
RDW: 15.3 % (ref 11.5–15.5)
WBC: 6.5 10*3/uL (ref 4.0–10.5)

## 2017-04-28 LAB — URINALYSIS, MICROSCOPIC (REFLEX)
RBC / HPF: NONE SEEN RBC/hpf (ref 0–5)
WBC UA: NONE SEEN WBC/hpf (ref 0–5)

## 2017-04-28 LAB — LIPASE, BLOOD: Lipase: 28 U/L (ref 11–51)

## 2017-04-28 MED ORDER — MORPHINE SULFATE (PF) 4 MG/ML IV SOLN
4.0000 mg | Freq: Once | INTRAVENOUS | Status: AC
  Administered 2017-04-28: 4 mg via INTRAVENOUS
  Filled 2017-04-28: qty 1

## 2017-04-28 MED ORDER — SODIUM CHLORIDE 0.9 % IV BOLUS (SEPSIS)
500.0000 mL | Freq: Once | INTRAVENOUS | Status: AC
  Administered 2017-04-28: 500 mL via INTRAVENOUS

## 2017-04-28 MED ORDER — IOPAMIDOL (ISOVUE-300) INJECTION 61%
100.0000 mL | Freq: Once | INTRAVENOUS | Status: AC | PRN
  Administered 2017-04-28: 100 mL via INTRAVENOUS

## 2017-04-28 MED ORDER — ONDANSETRON HCL 4 MG/2ML IJ SOLN
4.0000 mg | Freq: Once | INTRAMUSCULAR | Status: AC
  Administered 2017-04-28: 4 mg via INTRAVENOUS
  Filled 2017-04-28: qty 2

## 2017-04-28 NOTE — ED Provider Notes (Signed)
[facility] DEPT [facility] Provider Note   CSN: [id number] Arrival date & time: 04/28/17  1850     History   Chief Complaint Chief Complaint  Patient presents with  . Abdominal Pain    HPI Brad [name] is a 66 y.o. male.  Patient is a 66 year old male with a history of diabetes, cirrhosis from fatty liver and hypertension presenting today with abdominal pain. Patient states yesterday he started to develop midline abdominal pain above his belly button. It discontinued and he has extreme distention of his abdomen. Patient states his abdomen is always distended but it is tighter today than normal. It has not affected his eating and the pain does not seem to be worse after eating. He denies any nausea or vomiting. He did have a bowel movement earlier today without difficulty. He denies any urinary symptoms, fever or leg swelling this time. Patient states intermittently he has had chest pain and shortness of breath but he has not had that recently and he has not had it associated with the abdominal pain. Patient has a prior history of cholecystectomy a few years ago and was also found to have cirrhosis of his liver. He does not use any alcohol has not recently changed any medications but has had several courses of steroids for persistent rash that is still being evaluated. Patient has never had a paracentesis.   The history is provided by the patient.  Abdominal Pain   This is a new problem. The current episode started yesterday. The problem occurs constantly. The problem has been gradually worsening. The pain is associated with an unknown factor. The pain is located in the generalized abdominal region and periumbilical region. The quality of the pain is aching, sharp and shooting. The pain is at a severity of 8/10. The pain is severe. Pertinent negatives include fever, diarrhea, nausea, vomiting, constipation and dysuria. Associated symptoms comments: Patient states he feels slightly anorexic but was  able to eat a hamburger and another sandwich earlier today without difficulty. The symptoms are aggravated by certain positions and activity. Nothing relieves the symptoms. Past workup includes GI consult. Past medical history comments: History of nonalcoholic cirrhosis, diabetes.    Past Medical History:  Diagnosis Date  . Cirrhosis of liver not due to alcohol ([place])   . DDD (degenerative disc disease), lumbar   . Diabetes mellitus without complication ([place])   . Gout   . Hypertension     Patient Active Problem List   Diagnosis Date Noted  . Neck pain on left side 04/28/2013    Past Surgical History:  Procedure Laterality Date  . ADENOIDECTOMY    . ANKLE SURGERY    . CHOLECYSTECTOMY    . TONSILLECTOMY         Home Medications    Prior to Admission medications   Medication Sig Start Date End Date Taking? Authorizing Provider  allopurinol (ZYLOPRIM) 100 MG tablet Take 100 mg by mouth daily.    [provider]  amLODipine (NORVASC) 10 MG tablet Take 10 mg by mouth daily.    [provider]  azithromycin (ZITHROMAX) 250 MG tablet Take 1 tablet (250 mg total) by mouth daily. Take first 2 tablets together, then 1 every day until finished. 10/05/14   Leonard Schwartz, MD  fish oil-omega-3 fatty acids 1000 MG capsule Take 2 g by mouth daily.    [provider]  FLUoxetine (PROZAC) 20 MG capsule Take 20 mg by mouth daily.    [provider]  GABAPENTIN, PHN, PO Take by mouth.    [provider]  glipiZIDE (GLUCOTROL) 5 MG tablet Take 5 mg by mouth.    [provider]  hydrochlorothiazide (HYDRODIURIL) 25 MG tablet Take 25 mg by mouth daily.    [provider]  insulin glargine (LANTUS) 100 UNIT/ML injection Inject 35 Units into the skin at bedtime.    [provider]  Liraglutide (VICTOZA) 18 MG/3ML SOPN Inject into the skin.    [provider]  metFORMIN (GLUCOPHAGE) 500 MG tablet Take 500 mg by mouth 2  (two) times daily with a meal.    [provider]  metoCLOPramide (REGLAN) 5 MG tablet Take 5 mg by mouth.    [provider]  Multiple Vitamin (MULTIVITAMIN) capsule Take 1 capsule by mouth daily.    [provider]  omeprazole (PRILOSEC) 20 MG capsule Take 20 mg by mouth daily.    [provider]  solifenacin (VESICARE) 5 MG tablet Take 5 mg by mouth daily.    [provider]    Family History No family history on file.  Social History Social History  Substance Use Topics  . Smoking status: Former Research scientist (life sciences)  . Smokeless tobacco: Never Used  . Alcohol use No     Allergies   Penicillins   Review of Systems Review of Systems  Constitutional: Negative for fever.  Gastrointestinal: Positive for abdominal pain. Negative for constipation, diarrhea, nausea and vomiting.  Genitourinary: Negative for dysuria.  All other systems reviewed and are negative.    Physical Exam Updated Vital Signs BP 128/72 (BP Location: Right Arm)   Pulse 78   Temp 99 F (37.2 C) (Oral)   Resp 11   Wt 112 kg (247 lb)   SpO2 90%   BMI 33.50 kg/m   Physical Exam  Constitutional: He is oriented to person, place, and time. He appears well-developed and well-nourished. No distress.  HENT:  Head: Normocephalic and atraumatic.  Mouth/Throat: Oropharynx is clear and moist.  Eyes: Pupils are equal, round, and reactive to light. Conjunctivae and EOM are normal.  Neck: Normal range of motion. Neck supple.  Cardiovascular: Normal rate, regular rhythm and intact distal pulses.   No murmur heard. Pulmonary/Chest: Effort normal and breath sounds normal. No respiratory distress. He has no wheezes. He has no rales.  Abdominal: Soft. Bowel sounds are normal. He exhibits distension. There is tenderness in the right lower quadrant, periumbilical area, suprapubic area and left lower quadrant. There is no rebound, no guarding and no CVA tenderness.  Musculoskeletal: Normal  range of motion. He exhibits no edema or tenderness.  Normal sensation and pulse in bilateral feet  Neurological: He is alert and oriented to person, place, and time.  Skin: Skin is warm and dry. Rash noted. No erythema.  Diffuse patchy rash over the extremities and trunk. Areas on the abdomen are dry and flaky with erythema but not significantly raised. Also area on the left lower extremity which is more erythematous in nature but not indurated or draining.  Psychiatric: He has a normal mood and affect. His behavior is normal.  Nursing note and vitals reviewed.    ED Treatments / Results  Labs (all labs ordered are listed, but only abnormal results are displayed) Labs Reviewed  COMPREHENSIVE METABOLIC PANEL - Abnormal; Notable for the following:       Result Value   Potassium 3.0 (*)    Glucose, Bld 137 (*)    Albumin 3.0 (*)  AST 60 (*)    All other components within normal limits  URINALYSIS, ROUTINE W REFLEX MICROSCOPIC - Abnormal; Notable for the following:    Color, Urine ORANGE (*)    Glucose, UA >=500 (*)    Ketones, ur 15 (*)    All other components within normal limits  URINALYSIS, MICROSCOPIC (REFLEX) - Abnormal; Notable for the following:    Bacteria, UA RARE (*)    Squamous Epithelial / LPF 0-5 (*)    All other components within normal limits  LIPASE, BLOOD  CBC    EKG  EKG Interpretation  Date/Time:  Sunday April 28 2017 20:50:41 EDT Ventricular Rate:  79 PR Interval:    QRS Duration: 98 QT Interval:  418 QTC Calculation: 480 R Axis:   7 Text Interpretation:  Sinus rhythm new Low voltage, precordial leads Borderline T abnormalities, anterior leads Borderline prolonged QT interval Confirmed by Blanchie Dessert [phone]) on 04/28/2017 9:31:35 PM       Radiology Ct Abdomen Pelvis W Contrast  Result Date: 04/28/2017 CLINICAL DATA:  Periumbilical pain.  Rapid weight gain. EXAM: CT ABDOMEN AND PELVIS WITH CONTRAST TECHNIQUE: Multidetector CT imaging of  the abdomen and pelvis was performed using the standard protocol following bolus administration of intravenous contrast. CONTRAST:  129m ISOVUE-300 IOPAMIDOL (ISOVUE-300) INJECTION 61% COMPARISON:  None. FINDINGS: Lower chest: Mild linear scarring or atelectasis in the lung bases. No consolidation or effusion. Hepatobiliary: Markedly nodular liver consistent with cirrhosis. No suspicious focal liver lesion. There is evidence of portal hypertension, with prominent upper abdominal varices and recanalization of the umbilical vein. Portal vein is patent. Pancreas: Atrophic, otherwise normal. Spleen: Enlarged, without focal lesion. Adrenals/Urinary Tract: Both adrenals are normal. 2 mm collecting system calculi, left more than right. No ureteral calculi. No hydronephrosis. Unremarkable urinary bladder. Stomach/Bowel: Hiatal hernia. Stomach and small bowel are otherwise unremarkable. Appendix is normal. Colon is remarkable only for uncomplicated diverticulosis. Vascular/Lymphatic: The abdominal aorta is heavily calcified but normal in caliber. No adenopathy in the abdomen or pelvis. IVC is patent. Reproductive: Unremarkable Other: Large volume peritoneal ascites. Musculoskeletal: No significant skeletal lesion. Moderate lower lumbar facet arthritis. IMPRESSION: 1. Cirrhosis with portal hypertension and large volume ascites. Moderate splenomegaly. 2. Hiatal hernia. 3. Aortic atherosclerosis. 4. Colonic diverticulosis. Electronically Signed   By: DAndreas NewportM.D.   On: 04/28/2017 22:15    Procedures Procedures (including critical care time)  Medications Ordered in ED Medications  morphine 4 MG/ML injection 4 mg (4 mg Intravenous Given 04/28/17 2132)  ondansetron (ZOFRAN) injection 4 mg (4 mg Intravenous Given 04/28/17 2131)  sodium chloride 0.9 % bolus 500 mL (500 mLs Intravenous New Bag/Given 04/28/17 2133)  iopamidol (ISOVUE-300) 61 % injection 100 mL (100 mLs Intravenous Contrast Given 04/28/17 2142)      Initial Impression / Assessment and Plan / ED Course  I have reviewed the triage vital signs and the nursing notes.  Pertinent labs & imaging results that were available during my care of the patient were reviewed by me and considered in my medical decision making (see chart for details).     Patient is a 66year old gentleman presenting with abdominal discomfort. He denies any infectious symptoms such as fever or diarrhea. Patient denies any nausea or vomiting and pain is not worsened by eating. Patient does have a known history for cirrhosis is not currently receiving any treatment for that. He presents today because his abdomen is more distended he has gained 5 pounds since his last doctor's visit and his  abdomen is hurting. On exam patient has periumbilical and somewhat diffuse abdominal tenderness. Patient's vital signs are within normal limits on exam and he is in no acute distress. There is no evidence of CHF such as rales or distal edema concerning for fluid overload. On EKG has low voltage and some mild nonspecific changes but nothing to indicate MI.  CBC within normal limits, CMP with hypokalemia of 3.0 otherwise improved liver function tests. CT shows large volume ascites but no other acute findings. Feel that patient's ascites is most likely the cause of his discomfort. He has no obstruction, diverticulitis, appendicitis, pancreatitis or incarcerated hernias. He is stable at this time. Patient has never had a paracentesis has no fever and low suspicion that he has SBP.  Also no evidence of AAA or vascular abnormality on CT. Discussed with patient and his wife results of the tests and importance on following up with his PCP and GI for ongoing treatment of his psoriasis. He may benefit from spironolactone in the future.  Stress the importance of returning if he develops fever, vomiting or altered mental status.  Final Clinical Impressions(s) / ED Diagnoses   Final diagnoses:  Other  ascites  Epigastric pain    New Prescriptions New Prescriptions   No medications on file     Blanchie Dessert, MD 04/28/17 2322

## 2017-04-28 NOTE — ED Notes (Signed)
Patient transported to CT 

## 2017-04-28 NOTE — ED Triage Notes (Signed)
Pt reports substantial abd swelling since dinner time last night and pain above the umbilicus. Denies N/V/D. Describes pain as sharp.

## 2017-04-28 NOTE — ED Notes (Signed)
CT requested to provide disc of images to patient prior to d/c

## 2017-04-28 NOTE — ED Notes (Signed)
Patient c/o generzalized abd tenderness, abd distention, and pain @ 2" above the umbilicus. Patient states pain started yesterday afternoon. Patient denies N/V/D. A&Ox4. Patient states he has Sleep Apnea, SPO2 88-90% on room air.

## 2017-07-24 ENCOUNTER — Encounter: Payer: Self-pay | Admitting: *Deleted

## 2017-07-24 ENCOUNTER — Ambulatory Visit: Payer: Medicare HMO | Admitting: Cardiovascular Disease

## 2017-07-24 ENCOUNTER — Encounter: Payer: Self-pay | Admitting: Cardiovascular Disease

## 2017-07-24 ENCOUNTER — Other Ambulatory Visit: Payer: Self-pay | Admitting: *Deleted

## 2017-07-24 ENCOUNTER — Ambulatory Visit
Admission: RE | Admit: 2017-07-24 | Discharge: 2017-07-24 | Disposition: A | Discharge status: Home / Self Care | Payer: Medicare HMO | Source: Ambulatory Visit | Attending: Cardiovascular Disease | Admitting: Cardiovascular Disease

## 2017-07-24 DIAGNOSIS — R9439 Abnormal result of other cardiovascular function study: Secondary | ICD-10-CM | POA: Insufficient documentation

## 2017-07-24 DIAGNOSIS — I208 Other forms of angina pectoris: Secondary | ICD-10-CM

## 2017-07-24 LAB — APTT: aPTT: 29 s (ref 24–33)

## 2017-07-24 LAB — BASIC METABOLIC PANEL
BUN/Creatinine Ratio: 15 (ref 10–24)
BUN: 16 mg/dL (ref 8–27)
CALCIUM: 9.4 mg/dL (ref 8.6–10.2)
CHLORIDE: 103 mmol/L (ref 96–106)
CO2: 23 mmol/L (ref 20–29)
Creatinine, Ser: 1.1 mg/dL (ref 0.76–1.27)
GFR calc non Af Amer: 70 mL/min/{1.73_m2} (ref >59)
GFR, EST AFRICAN AMERICAN: 80 mL/min/{1.73_m2} (ref >59)
GLUCOSE: 299 mg/dL — AB (ref 65–99)
POTASSIUM: 4.9 mmol/L (ref 3.5–5.2)
Sodium: 140 mmol/L (ref 134–144)

## 2017-07-24 LAB — CBC WITH DIFFERENTIAL/PLATELET
BASOS ABS: 0 10*3/uL (ref 0.0–0.2)
Basos: 1 %
EOS (ABSOLUTE): 0.3 10*3/uL (ref 0.0–0.4)
Eos: 6 %
Hematocrit: 38.7 % (ref 37.5–51.0)
Hemoglobin: 12.9 g/dL — ABNORMAL LOW (ref 13.0–17.7)
IMMATURE GRANS (ABS): 0 10*3/uL (ref 0.0–0.1)
Immature Granulocytes: 0 %
LYMPHS ABS: 1.6 10*3/uL (ref 0.7–3.1)
LYMPHS: 32 %
MCH: 28.2 pg (ref 26.6–33.0)
MCHC: 33.3 g/dL (ref 31.5–35.7)
MCV: 85 fL (ref 79–97)
MONOCYTES: 10 %
Monocytes Absolute: 0.5 10*3/uL (ref 0.1–0.9)
NEUTROS ABS: 2.6 10*3/uL (ref 1.4–7.0)
Neutrophils: 51 %
PLATELETS: 131 10*3/uL — AB (ref 150–379)
RBC: 4.58 x10E6/uL (ref 4.14–5.80)
RDW: 16.1 % — ABNORMAL HIGH (ref 12.3–15.4)
WBC: 5.1 10*3/uL (ref 3.4–10.8)

## 2017-07-24 LAB — PROTIME-INR
INR: 1.1 (ref 0.8–1.2)
PROTHROMBIN TIME: 11.6 s (ref 9.1–12.0)

## 2017-07-24 LAB — TSH: TSH: 1.5 u[IU]/mL (ref 0.450–4.500)

## 2017-07-24 NOTE — Assessment & Plan Note (Signed)
Mr. Goetzke was referred by Roque Cash PA-C for nitrate responsive chest pain and an abnormal stress test. His risk factors include tobacco abuse, hypertension, diabetes and hyperlipidemia. He had a Myoview stress test performed 07/11/17 revealing mild to moderate ischemia in the mid anterior septum and inferior scar. Based on this we will proceed with outpatient radial diagnostic coronary angiography and potential intervention.The patient understands that risks included but are not limited to stroke (1 in 1000), death (1 in 56), kidney failure [usually temporary] (1 in 500), bleeding (1 in 200), allergic reaction [possibly serious] (1 in 200). The patient understands and agrees to proceed

## 2017-07-24 NOTE — Progress Notes (Signed)
07/24/2017 Brad [name]   Aug 30, 1950  [id number]  Primary Physician Glendon Axe, MD Primary Cardiologist: Lorretta Harp MD Brad [name], [name], [place]  HPI:  Brad [name] is a 66 y.o. male married, father of 2, grandfather of 5 grandchildren referred by Roque Cash PA-C for cardiac catheterization because of new onset nitrate responsive chest pain and an abnormal Myoview stress test. He is a retired/disabled long-distance truck driver. He smokes a pack of cigarettes a week. His other problems include cirrhosis secondary to NASH and obstructive sleep apnea. He developed chest pain approximately 6 months ago. Occurs on a daily basis. A Myoview stress test performed 07/11/17 showed mild to moderate ischemia in the mid anterior wall and septum along with inferior scar.   Current Meds  Medication Sig  . aspirin EC 81 MG tablet Take 81 mg by mouth daily.  . canagliflozin (INVOKANA) 100 MG TABS tablet Take 100 mg by mouth daily.  Marland Kitchen liraglutide (VICTOZA) 18 MG/3ML SOPN Inject 1.8 mg into the skin daily.  . Multiple Vitamin (MULTIVITAMIN) capsule Take 1 capsule by mouth daily.  Marland Kitchen tiotropium (SPIRIVA HANDIHALER) 18 MCG inhalation capsule Place 12 mcg into inhaler and inhale daily.     Allergies  Allergen Reactions  . Lisinopril Swelling  . Penicillin G Swelling  . Penicillins Hives    Social History   Socioeconomic History  . Marital status: Married    Spouse name: Not on file  . Number of children: Not on file  . Years of education: Not on file  . Highest education level: Not on file  Social Needs  . Financial resource strain: Not on file  . Food insecurity - worry: Not on file  . Food insecurity - inability: Not on file  . Transportation needs - medical: Not on file  . Transportation needs - non-medical: Not on file  Occupational History  . Not on file  Tobacco Use  . Smoking status: Former Research scientist (life sciences)  . Smokeless tobacco: Never Used  Substance and Sexual Activity  . Alcohol  use: No  . Drug use: No  . Sexual activity: Not on file  Other Topics Concern  . Not on file  Social History Narrative  . Not on file     Review of Systems: General: negative for chills, fever, night sweats or weight changes.  Cardiovascular: negative for chest pain, dyspnea on exertion, edema, orthopnea, palpitations, paroxysmal nocturnal dyspnea or shortness of breath Dermatological: negative for rash Respiratory: negative for cough or wheezing Urologic: negative for hematuria Abdominal: negative for nausea, vomiting, diarrhea, bright red blood per rectum, melena, or hematemesis Neurologic: negative for visual changes, syncope, or dizziness All other systems reviewed and are otherwise negative except as noted above.    Pulse 81, height 6' (1.829 m), weight 246 lb (111.6 kg).  General appearance: alert and no distress Neck: no adenopathy, no carotid bruit, no JVD, supple, symmetrical, trachea midline and thyroid not enlarged, symmetric, no tenderness/mass/nodules Lungs: clear to auscultation bilaterally Heart: regular rate and rhythm, S1, S2 normal, no murmur, click, rub or gallop Extremities: extremities normal, atraumatic, no cyanosis or edema Pulses: 2+ and symmetric Skin: Skin color, texture, turgor normal. No rashes or lesions Neurologic: Alert and oriented X 3, normal strength and tone. Normal symmetric reflexes. Normal coordination and gait  EKG sinus rhythm 81 without ST or T-wave changes. I personally reviewed this EKG.  ASSESSMENT AND PLAN:   Abnormal nuclear stress test Mr. Purohit was referred by Roque Cash  PA-C for nitrate responsive chest pain and an abnormal stress test. His risk factors include tobacco abuse, hypertension, diabetes and hyperlipidemia. He had a Myoview stress test performed 07/11/17 revealing mild to moderate ischemia in the mid anterior septum and inferior scar. Based on this we will proceed with outpatient radial diagnostic coronary angiography and  potential intervention.The patient understands that risks included but are not limited to stroke (1 in 1000), death (1 in 83), kidney failure [usually temporary] (1 in 500), bleeding (1 in 200), allergic reaction [possibly serious] (1 in 200). The patient understands and agrees to proceed      Lorretta Harp MD [facility], [facility] 07/24/2017 10:39 AM

## 2017-07-24 NOTE — Addendum Note (Signed)
Addended by: Therisa Doyne on: 07/24/2017 10:55 AM   Modules accepted: Orders

## 2017-07-24 NOTE — H&P (View-Only) (Signed)
07/24/2017 Brad [name]   [birth year]  [id number]  Primary Physician Glendon Axe, MD Primary Cardiologist: Lorretta Harp MD [place]ret [place], [place], [place]  HPI:  Brad [name] is a 66 y.o. male married, father of 2, grandfather of 5 grandchildren referred by Roque Cash PA-C for cardiac catheterization because of new onset nitrate responsive chest pain and an abnormal Myoview stress test. He is a retired/disabled long-distance truck driver. He smokes a pack of cigarettes a week. His other problems include cirrhosis secondary to NASH and obstructive sleep apnea. He developed chest pain approximately 6 months ago. Occurs on a daily basis. A Myoview stress test performed 07/11/17 showed mild to moderate ischemia in the mid anterior wall and septum along with inferior scar.   Current Meds  Medication Sig  . aspirin EC 81 MG tablet Take 81 mg by mouth daily.  . canagliflozin (INVOKANA) 100 MG TABS tablet Take 100 mg by mouth daily.  Marland Kitchen liraglutide (VICTOZA) 18 MG/3ML SOPN Inject 1.8 mg into the skin daily.  . Multiple Vitamin (MULTIVITAMIN) capsule Take 1 capsule by mouth daily.  Marland Kitchen tiotropium (SPIRIVA HANDIHALER) 18 MCG inhalation capsule Place 12 mcg into inhaler and inhale daily.     Allergies  Allergen Reactions  . Lisinopril Swelling  . Penicillin G Swelling  . Penicillins Hives    Social History   Socioeconomic History  . Marital status: Married    Spouse name: Not on file  . Number of children: Not on file  . Years of education: Not on file  . Highest education level: Not on file  Social Needs  . Financial resource strain: Not on file  . Food insecurity - worry: Not on file  . Food insecurity - inability: Not on file  . Transportation needs - medical: Not on file  . Transportation needs - non-medical: Not on file  Occupational History  . Not on file  Tobacco Use  . Smoking status: Former Research scientist (life sciences)  . Smokeless tobacco: Never Used  Substance and Sexual Activity  . Alcohol  use: No  . Drug use: No  . Sexual activity: Not on file  Other Topics Concern  . Not on file  Social History Narrative  . Not on file     Review of Systems: General: negative for chills, fever, night sweats or weight changes.  Cardiovascular: negative for chest pain, dyspnea on exertion, edema, orthopnea, palpitations, paroxysmal nocturnal dyspnea or shortness of breath Dermatological: negative for rash Respiratory: negative for cough or wheezing Urologic: negative for hematuria Abdominal: negative for nausea, vomiting, diarrhea, bright red blood per rectum, melena, or hematemesis Neurologic: negative for visual changes, syncope, or dizziness All other systems reviewed and are otherwise negative except as noted above.    Pulse 81, height 6' (1.829 m), weight 246 lb (111.6 kg).  General appearance: alert and no distress Neck: no adenopathy, no carotid bruit, no JVD, supple, symmetrical, trachea midline and thyroid not enlarged, symmetric, no tenderness/mass/nodules Lungs: clear to auscultation bilaterally Heart: regular rate and rhythm, S1, S2 normal, no murmur, click, rub or gallop Extremities: extremities normal, atraumatic, no cyanosis or edema Pulses: 2+ and symmetric Skin: Skin color, texture, turgor normal. No rashes or lesions Neurologic: Alert and oriented X 3, normal strength and tone. Normal symmetric reflexes. Normal coordination and gait  EKG sinus rhythm 81 without ST or T-wave changes. I personally reviewed this EKG.  ASSESSMENT AND PLAN:   Abnormal nuclear stress test [name] was referred by Roque Cash  PA-C for nitrate responsive chest pain and an abnormal stress test. His risk factors include tobacco abuse, hypertension, diabetes and hyperlipidemia. He had a Myoview stress test performed 07/11/17 revealing mild to moderate ischemia in the mid anterior septum and inferior scar. Based on this we will proceed with outpatient radial diagnostic coronary angiography and  potential intervention.The patient understands that risks included but are not limited to stroke (1 in 1000), death (1 in 51), kidney failure [usually temporary] (1 in 500), bleeding (1 in 200), allergic reaction [possibly serious] (1 in 200). The patient understands and agrees to proceed      Lorretta Harp MD [facility], [facility] 07/24/2017 10:39 AM

## 2017-07-24 NOTE — Patient Instructions (Addendum)
   [facility] [address] [place] [address] Dept: [phone] Loc: [place]  07/24/2017  You are scheduled for a Cardiac Catheterization on Monday, December 3 with Dr. [name].  1. Please arrive at the [facility] (Main Entrance A) at [facility]: [address] [place], [place] [address] at 10:30 AM (two hours before your procedure to ensure your preparation). Free valet parking service is available.   Special note: Every effort is made to have your procedure done on time. Please understand that emergencies sometimes delay scheduled procedures.  2. Diet: Do not eat or drink anything after midnight prior to your procedure except sips of water to take medications.  3. Labs: Please have labs drawn in our office today.  4. Medication instructions in preparation for your procedure:  Stop taking, Glucophage (Metformin) on Sunday, December 2.    Take only 25 units of insulin the night before your procedure. Do not take any insulin on the day of the procedure.   Do not take any diabetic medications on the morning of your procedure.  On the morning of your procedure, take your Aspirin and any morning medicines NOT listed above.  You may use sips of water.  5. Plan for one night stay--bring personal belongings. 6. Bring a current list of your medications and current insurance cards. 7. You MUST have a responsible person to drive you home. 8. Someone MUST be with you the first 24 hours after you arrive home or your discharge will be delayed. 9. Please wear clothes that are easy to get on and off and wear slip-on shoes.  Thank you for allowing [place] to care for you!   -- [facility]

## 2017-07-25 ENCOUNTER — Telehealth: Payer: Self-pay

## 2017-07-25 NOTE — Telephone Encounter (Signed)
Patient contacted pre-catheterization at [facility] scheduled for:  07/29/2017 @ 1230 Verified arrival time and place:  [facility] @ 1030 Confirmed AM meds to be taken pre-cath with sip of water: Take ASA Hold Metformin starting Sunday PM prior to procedure take 1/2 amount long acting insulin Day of procedure hold:  Metformin, insulins, spiro, lasix, invokana and glipizide Confirmed patient has responsible person to drive home post procedure and observe patient for 24 hours:  yes Addl concerns:  none

## 2017-07-26 ENCOUNTER — Other Ambulatory Visit: Payer: Self-pay | Admitting: Cardiovascular Disease

## 2017-07-26 DIAGNOSIS — R9439 Abnormal result of other cardiovascular function study: Secondary | ICD-10-CM

## 2017-07-26 NOTE — Addendum Note (Signed)
Addended by: Zebedee Iba on: 07/26/2017 04:11 PM   Modules accepted: Orders

## 2017-07-29 ENCOUNTER — Ambulatory Visit (HOSPITAL_COMMUNITY)
Admission: RE | Admit: 2017-07-29 | Discharge: 2017-07-29 | Disposition: A | Discharge status: Home / Self Care | Payer: Medicare HMO | Source: Ambulatory Visit | Attending: Cardiovascular Disease | Admitting: Cardiovascular Disease

## 2017-07-29 ENCOUNTER — Encounter (HOSPITAL_COMMUNITY)
Admission: RE | Disposition: A | Discharge status: Home / Self Care | Payer: Self-pay | Source: Ambulatory Visit | Attending: Cardiovascular Disease

## 2017-07-29 DIAGNOSIS — Z7982 Long term (current) use of aspirin: Secondary | ICD-10-CM | POA: Diagnosis not present

## 2017-07-29 DIAGNOSIS — Z79899 Other long term (current) drug therapy: Secondary | ICD-10-CM | POA: Insufficient documentation

## 2017-07-29 DIAGNOSIS — R9439 Abnormal result of other cardiovascular function study: Secondary | ICD-10-CM | POA: Diagnosis not present

## 2017-07-29 DIAGNOSIS — G4733 Obstructive sleep apnea (adult) (pediatric): Secondary | ICD-10-CM | POA: Diagnosis not present

## 2017-07-29 DIAGNOSIS — I208 Other forms of angina pectoris: Secondary | ICD-10-CM | POA: Diagnosis not present

## 2017-07-29 DIAGNOSIS — K7460 Unspecified cirrhosis of liver: Secondary | ICD-10-CM | POA: Insufficient documentation

## 2017-07-29 DIAGNOSIS — Z880 Allergy status to penicillin: Secondary | ICD-10-CM | POA: Insufficient documentation

## 2017-07-29 DIAGNOSIS — I25110 Atherosclerotic heart disease of native coronary artery with unstable angina pectoris: Secondary | ICD-10-CM | POA: Diagnosis not present

## 2017-07-29 DIAGNOSIS — R079 Chest pain, unspecified: Secondary | ICD-10-CM | POA: Diagnosis present

## 2017-07-29 DIAGNOSIS — K7581 Nonalcoholic steatohepatitis (NASH): Secondary | ICD-10-CM | POA: Diagnosis not present

## 2017-07-29 DIAGNOSIS — Z888 Allergy status to other drugs, medicaments and biological substances status: Secondary | ICD-10-CM | POA: Insufficient documentation

## 2017-07-29 DIAGNOSIS — I2089 Other forms of angina pectoris: Secondary | ICD-10-CM | POA: Diagnosis present

## 2017-07-29 DIAGNOSIS — F17210 Nicotine dependence, cigarettes, uncomplicated: Secondary | ICD-10-CM | POA: Insufficient documentation

## 2017-07-29 HISTORY — PX: LEFT HEART CATH AND CORONARY ANGIOGRAPHY: CATH118249

## 2017-07-29 LAB — GLUCOSE, CAPILLARY
GLUCOSE-CAPILLARY: 191 mg/dL — AB (ref 65–99)
GLUCOSE-CAPILLARY: 160 mg/dL — AB (ref 65–99)

## 2017-07-29 SURGERY — LEFT HEART CATH AND CORONARY ANGIOGRAPHY
Anesthesia: LOCAL

## 2017-07-29 MED ORDER — FENTANYL CITRATE (PF) 100 MCG/2ML IJ SOLN
INTRAMUSCULAR | Status: AC
  Filled 2017-07-29: qty 2

## 2017-07-29 MED ORDER — SODIUM CHLORIDE 0.9% FLUSH: 3.0000 mL | INTRAVENOUS | Status: DC | PRN

## 2017-07-29 MED ORDER — HEPARIN SODIUM (PORCINE) 1000 UNIT/ML IJ SOLN
INTRAMUSCULAR | Status: DC | PRN
  Administered 2017-07-29: 5000 [IU] via INTRAVENOUS

## 2017-07-29 MED ORDER — HEPARIN SODIUM (PORCINE) 1000 UNIT/ML IJ SOLN
INTRAMUSCULAR | Status: AC
  Filled 2017-07-29: qty 1

## 2017-07-29 MED ORDER — LIDOCAINE HCL (PF) 1 % IJ SOLN
INTRAMUSCULAR | Status: DC | PRN
  Administered 2017-07-29: 10 mL via INTRA_ARTERIAL

## 2017-07-29 MED ORDER — ACETAMINOPHEN 325 MG PO TABS: 650.0000 mg | ORAL_TABLET | ORAL | Status: DC | PRN

## 2017-07-29 MED ORDER — ONDANSETRON HCL 4 MG/2ML IJ SOLN: 4.0000 mg | Freq: Four times a day (QID) | INTRAMUSCULAR | Status: DC | PRN

## 2017-07-29 MED ORDER — SODIUM CHLORIDE 0.9% FLUSH: 3.0000 mL | Freq: Two times a day (BID) | INTRAVENOUS | Status: DC

## 2017-07-29 MED ORDER — IOPAMIDOL (ISOVUE-370) INJECTION 76%
INTRAVENOUS | Status: DC | PRN
  Administered 2017-07-29: 80 mL

## 2017-07-29 MED ORDER — SODIUM CHLORIDE 0.9 % WEIGHT BASED INFUSION: 1.0000 mL/kg/h | INTRAVENOUS | Status: DC

## 2017-07-29 MED ORDER — IOPAMIDOL (ISOVUE-370) INJECTION 76%
INTRAVENOUS | Status: AC
  Filled 2017-07-29: qty 100

## 2017-07-29 MED ORDER — HEPARIN (PORCINE) IN NACL 2-0.9 UNIT/ML-% IJ SOLN
INTRAMUSCULAR | Status: AC | PRN
  Administered 2017-07-29: 1000 mL

## 2017-07-29 MED ORDER — SODIUM CHLORIDE 0.9 % WEIGHT BASED INFUSION
3.0000 mL/kg/h | INTRAVENOUS | Status: AC
  Administered 2017-07-29: 3 mL/kg/h via INTRAVENOUS

## 2017-07-29 MED ORDER — MIDAZOLAM HCL 2 MG/2ML IJ SOLN
INTRAMUSCULAR | Status: AC
  Filled 2017-07-29: qty 2

## 2017-07-29 MED ORDER — MIDAZOLAM HCL 2 MG/2ML IJ SOLN
INTRAMUSCULAR | Status: DC | PRN
  Administered 2017-07-29: 1 mg via INTRAVENOUS

## 2017-07-29 MED ORDER — VERAPAMIL HCL 2.5 MG/ML IV SOLN
INTRAVENOUS | Status: AC
  Filled 2017-07-29: qty 2

## 2017-07-29 MED ORDER — FENTANYL CITRATE (PF) 100 MCG/2ML IJ SOLN
INTRAMUSCULAR | Status: DC | PRN
  Administered 2017-07-29: 25 ug via INTRAVENOUS

## 2017-07-29 MED ORDER — LIDOCAINE HCL (PF) 1 % IJ SOLN
INTRAMUSCULAR | Status: AC
  Filled 2017-07-29: qty 30

## 2017-07-29 MED ORDER — SODIUM CHLORIDE 0.9 % IV SOLN
INTRAVENOUS | Status: AC
  Administered 2017-07-29: 16:00:00 via INTRAVENOUS

## 2017-07-29 MED ORDER — HEPARIN (PORCINE) IN NACL 2-0.9 UNIT/ML-% IJ SOLN
INTRAMUSCULAR | Status: AC
  Filled 2017-07-29: qty 1000

## 2017-07-29 MED ORDER — SODIUM CHLORIDE 0.9 % IV SOLN: 250.0000 mL | INTRAVENOUS | Status: DC | PRN

## 2017-07-29 MED ORDER — ASPIRIN 81 MG PO CHEW: 81.0000 mg | CHEWABLE_TABLET | ORAL | Status: DC

## 2017-07-29 MED ORDER — LIDOCAINE HCL (PF) 1 % IJ SOLN
INTRAMUSCULAR | Status: DC | PRN
  Administered 2017-07-29: 2 mL

## 2017-07-29 SURGICAL SUPPLY — 12 items

## 2017-07-29 NOTE — Interval H&P Note (Signed)
Cath Lab Visit (complete for each Cath Lab visit)  Clinical Evaluation Leading to the Procedure:   ACS: No.  Non-ACS:    Anginal Classification: CCS II  Anti-ischemic medical therapy: Maximal Therapy (2 or more classes of medications)  Non-Invasive Test Results: Intermediate-risk stress test findings: cardiac mortality 1-3%/year  Prior CABG: No previous CABG      History and Physical Interval Note:  07/29/2017 3:10 PM  Brad [name]  has presented today for surgery, with the diagnosis of abnormal stress test  The various methods of treatment have been discussed with the patient and family. After consideration of risks, benefits and other options for treatment, the patient has consented to  Procedure(s): LEFT HEART CATH AND CORONARY ANGIOGRAPHY (N/A) as a surgical intervention .  The patient's history has been reviewed, patient examined, no change in status, stable for surgery.  I have reviewed the patient's chart and labs.  Questions were answered to the patient's satisfaction.     Brad [name]

## 2017-07-29 NOTE — Discharge Instructions (Signed)
NO METFORMIN/GLUCOPHAGE FOR 2 DAYS ° ° °Radial Site Care °Refer to this sheet in the next few weeks. These instructions provide you with information about caring for yourself after your procedure. Your health care provider may also give you more specific instructions. Your treatment has been planned according to current medical practices, but problems sometimes occur. Call your health care provider if you have any problems or questions after your procedure. °What can I expect after the procedure? °After your procedure, it is typical to have the following: °· Bruising at the radial site that usually fades within 1-2 weeks. °· Blood collecting in the tissue (hematoma) that may be painful to the touch. It should usually decrease in size and tenderness within 1-2 weeks. ° °Follow these instructions at home: °· Take medicines only as directed by your health care provider. °· You may shower 24-48 hours after the procedure or as directed by your health care provider. Remove the bandage (dressing) and gently wash the site with plain soap and water. Pat the area dry with a clean towel. Do not rub the site, because this may cause bleeding. °· Do not take baths, swim, or use a hot tub until your health care provider approves. °· Check your insertion site every day for redness, swelling, or drainage. °· Do not apply powder or lotion to the site. °· Do not flex or bend the affected arm for 24 hours or as directed by your health care provider. °· Do not push or pull heavy objects with the affected arm for 24 hours or as directed by your health care provider. °· Do not lift over 10 lb (4.5 kg) for 5 days after your procedure or as directed by your health care provider. °· Ask your health care provider when it is okay to: °? Return to work or school. °? Resume usual physical activities or sports. °? Resume sexual activity. °· Do not drive home if you are discharged the same day as the procedure. Have someone else drive you. °· You  may drive 24 hours after the procedure unless otherwise instructed by your health care provider. °· Do not operate machinery or power tools for 24 hours after the procedure. °· If your procedure was done as an outpatient procedure, which means that you went home the same day as your procedure, a responsible adult should be with you for the first 24 hours after you arrive home. °· Keep all follow-up visits as directed by your health care provider. This is important. °Contact a health care provider if: °· You have a fever. °· You have chills. °· You have increased bleeding from the radial site. Hold pressure on the site. °Get help right away if: °· You have unusual pain at the radial site. °· You have redness, warmth, or swelling at the radial site. °· You have drainage (other than a small amount of blood on the dressing) from the radial site. °· The radial site is bleeding, and the bleeding does not stop after 30 minutes of holding steady pressure on the site. °· Your arm or hand becomes pale, cool, tingly, or numb. °This information is not intended to replace advice given to you by your health care provider. Make sure you discuss any questions you have with your health care provider. °Document Released: 09/15/2010 Document Revised: 01/19/2016 Document Reviewed: 03/01/2014 °Elsevier Interactive Patient Education © 2018 [employer] ° °

## 2017-07-29 NOTE — Progress Notes (Signed)
Report received from Lisa McMahon,RN  

## 2017-07-30 ENCOUNTER — Encounter (HOSPITAL_COMMUNITY): Payer: Self-pay | Admitting: Cardiovascular Disease

## 2019-02-06 ENCOUNTER — Other Ambulatory Visit: Payer: Self-pay | Admitting: Neurosurgery

## 2019-02-06 DIAGNOSIS — K7460 Unspecified cirrhosis of liver: Secondary | ICD-10-CM

## 2019-02-10 ENCOUNTER — Other Ambulatory Visit: Payer: Self-pay | Admitting: Interventional Radiology

## 2019-02-10 ENCOUNTER — Other Ambulatory Visit: Payer: Self-pay | Admitting: *Deleted

## 2019-02-10 ENCOUNTER — Ambulatory Visit
Admission: RE | Admit: 2019-02-10 | Discharge: 2019-02-10 | Disposition: A | Discharge status: Home / Self Care | Payer: Medicare HMO | Source: Ambulatory Visit | Attending: Neurosurgery | Admitting: Neurosurgery

## 2019-02-10 ENCOUNTER — Encounter: Payer: Self-pay | Admitting: *Deleted

## 2019-02-10 DIAGNOSIS — K7581 Nonalcoholic steatohepatitis (NASH): Secondary | ICD-10-CM

## 2019-02-10 DIAGNOSIS — R188 Other ascites: Secondary | ICD-10-CM

## 2019-02-10 DIAGNOSIS — K7460 Unspecified cirrhosis of liver: Secondary | ICD-10-CM

## 2019-02-10 HISTORY — PX: IR RADIOLOGIST EVAL & MGMT: IMG5224

## 2019-02-10 NOTE — Consult Note (Signed)
Chief Complaint: Brad [name] cirrhosis.  Refractory ascites.  Recurrent upper GI bleed  Referring Physician(s): Rhoton,Eric L.  History of Present Illness: Brad [name] is a 68 y.o. male with past medical history significant for poorly controlled diabetes, hypertension and NASH cirrhosis who is seen today in video consultation for evaluation for potential TIPS creation.  Patient is accompanied by his wife who provides the majority of the medical history.  The patient is well-known to the interventional radiology service after undergoing multiple paracenteses this year at [facility].  Fortunately, the patient's paracentesis frequency and volume has decreased recently, previously requiring weekly paracenteses in April yielding 4 to 6 L per session with downward trend during the month of May with 3.75 L drained on 5/5, 2.3 L on 5/12 and 1.4 L on 5/19.  The patient has not required paracentesis since 5/19 and states he currently is not symptomatic from intra-abdominal ascites and has been compliant with his diuretic therapy which entails spironolactone 100 mg twice daily and Lasix 80 mg daily.  Specifically, no abdominal distention, abdominal pain or shortness of breath.  By report, the patient has experienced at least 3 upper GI bleeds, all of which were successfully controlled endoscopically with banding, the most recent episode occuring 11/21/2018.  The patient currently denies hematemesis, bloody or melanotic stools.  The patient reports issues with confusion in the past, which his wife states are worse at the time of activity, though she states he has been compliant with his prescribed dose of the lactulose 30 mL TID.  Patient is otherwise without complaint.  Specifically, no chest pain or shortness of breath.  No yellowing of the skin or eyes.  No unintentional weight loss or weight gain.    Past Medical History:  Diagnosis Date  . 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 DDD (degenerative disc disease), lumbar   . Diabetes mellitus without complication ([place])   . Gout   . Hypertension     Past Surgical History:  Procedure Laterality Date  . ADENOIDECTOMY    . ANKLE SURGERY    . CHOLECYSTECTOMY    . LEFT HEART CATH AND CORONARY ANGIOGRAPHY N/A 07/29/2017   Procedure: LEFT HEART CATH AND CORONARY ANGIOGRAPHY;  Surgeon: Lorretta Harp, MD;  Location: [facility];  Service: Cardiovascular;  Laterality: N/A;  . TONSILLECTOMY      Allergies: Lisinopril and Penicillins  Medications: Prior to Admission medications   Medication Sig Start Date End Date Taking? Authorizing Provider  allopurinol (ZYLOPRIM) 100 MG tablet Take 100 mg by mouth daily.    [provider]  amLODipine (NORVASC) 10 MG tablet Take 10 mg by mouth daily.    [provider]  aspirin EC 81 MG tablet Take 81 mg by mouth daily.    [provider]  calcium carbonate (TUMS - DOSED IN MG ELEMENTAL CALCIUM) 500 MG chewable tablet Chew 1-2 tablets by mouth daily as needed for indigestion or heartburn.    [provider]  canagliflozin (INVOKANA) 100 MG TABS tablet Take 100 mg by mouth daily. 07/04/17   [provider]  fish oil-omega-3 fatty acids 1000 MG capsule Take 1 g by mouth daily.     [provider]  fluocinonide cream (LIDEX) 2.13 % Apply 1 application topically 2 (two) times daily as needed for itching. 06/20/17   [provider]  furosemide (LASIX) 80 MG tablet Take 80 mg by mouth daily. 06/06/17  [provider]  gabapentin (NEURONTIN) 600 MG tablet Take 600 mg by mouth 3 (three) times daily. 06/26/17   [provider]  glipiZIDE (GLUCOTROL) 5 MG tablet Take 5 mg by mouth daily.     [provider]  hydrOXYzine (ATARAX/VISTARIL) 25 MG tablet Take 25 mg by mouth 3 (three) times daily.     [provider]  ibuprofen (ADVIL,MOTRIN) 200 MG tablet Take 400 mg by mouth daily as  needed for headache or moderate pain.    [provider]  insulin glargine (LANTUS) 100 UNIT/ML injection Inject 50 Units into the skin daily.    [provider]  isosorbide mononitrate (IMDUR) 30 MG 24 hr tablet Take 30 mg by mouth daily.    [provider]  liraglutide (VICTOZA) 18 MG/3ML SOPN Inject 1.8 mg into the skin at bedtime.  12/20/16   [provider]  Magnesium 250 MG TABS Take 250 mg by mouth at bedtime.     [provider]  metFORMIN (GLUCOPHAGE) 500 MG tablet Take 1,000 mg by mouth 2 (two) times daily.     [provider]  metoCLOPramide (REGLAN) 5 MG tablet Take 5 mg by mouth daily.     [provider]  Multiple Vitamin (MULTIVITAMIN) capsule Take 1 capsule by mouth daily.    [provider]  nitroGLYCERIN (NITROSTAT) 0.4 MG SL tablet Place 0.4 mg under the tongue every 5 (five) minutes x 3 doses as needed for chest pain.  07/12/17   [provider]  Oxycodone HCl 10 MG TABS Take 10 mg by mouth 3 (three) times daily as needed for pain. 06/26/17   [provider]  pantoprazole (PROTONIX) 40 MG tablet Take 40 mg by mouth daily.    [provider]  REPATHA SURECLICK 062 MG/ML SOAJ Inject 140 mg as directed every 14 (fourteen) days. On every other sundays 07/19/17   [provider]  solifenacin (VESICARE) 5 MG tablet Take 5 mg by mouth at bedtime.     [provider]  spironolactone (ALDACTONE) 100 MG tablet Take 100 mg by mouth daily. 07/22/17   [provider]  tiotropium (SPIRIVA HANDIHALER) 18 MCG inhalation capsule Place 12 mcg into inhaler and inhale daily. 05/31/15   [provider]  triamcinolone ointment (KENALOG) 0.1 % Apply 1 application topically 2 (two) times daily as needed for itching. 06/13/17   [provider]  zolpidem (AMBIEN CR) 6.25 MG CR tablet Take 6.25-12.5 mg by mouth at bedtime as needed for sleep. 06/26/17   [provider]     No family history on file.  Social History   Socioeconomic History  . Marital status: Married    Spouse name: Not on file  . Number of children: Not on file  . Years of education: Not on file  . Highest education level: Not on file  Occupational History  . Not on file  Social Needs  . Financial resource strain: Not on file  . Food insecurity    Worry: Not on file    Inability: Not on file  . Transportation needs    Medical: Not on file    Non-medical: Not on file  Tobacco Use  . Smoking status: Former Research scientist (life sciences)  . Smokeless tobacco: Never Used  Substance and Sexual Activity  . Alcohol use: No  . Drug use: No  . Sexual activity: Not on file  Lifestyle  . Physical activity    Days per week: Not on file  Minutes per session: Not on file  . Stress: Not on file  Relationships  . Social Herbalist on phone: Not on file    Gets together: Not on file    Attends religious service: Not on file    Active member of club or organization: Not on file    Attends meetings of clubs or organizations: Not on file    Relationship status: Not on file  Other Topics Concern  . Not on file  Social History Narrative  . Not on file    ECOG Status: 2 - Symptomatic, <50% confined to bed  Review of Systems  Constitutional: Positive for fatigue. Negative for activity change and unexpected weight change.  Respiratory: Negative.   Cardiovascular: Negative.   Gastrointestinal: Negative for abdominal distention, abdominal pain, blood in stool, nausea and vomiting.  Genitourinary: Negative.   Skin: Negative for color change.  Psychiatric/Behavioral: Positive for confusion.    Review of Systems: A 12 point ROS discussed and pertinent positives are indicated in the HPI above.  All other systems are negative.  Physical Exam No direct physical exam was performed (except for noted visual exam findings with Video Visits).   Vital Signs: There were no vitals  taken for this visit.  Imaging:  Multiple ultrasound-guided paracenteses, most recently on 01/13/2019 yielding 1.4 L of peritoneal fluid.  CT abdomen and pelvis - 10/13/2018  Labs:  All laboratory values are from 4/3 unless otherwise specified  HgB - 12.9 HCT - 37.6 OKT - 107  Sodium - 134 Bilirubin - 0.9 Creatinine - 1.34 INR - 1.1.  Na MELD - 15.  Ammonia - 172; previously, 84 on 3/17.  Assessment and Plan:  ANTONI STEFAN is a 68 y.o. male with past medical history significant for poorly controlled diabetes, hypertension and NASH cirrhosis who is seen today in video consultation for evaluation for potential TIPS creation.   The patient is well-known to the interventional radiology service after undergoing multiple paracenteses this year at [facility].  Fortunately, the patient's paracentesis frequency and volume has decreased recently and he has not required paracentesis since 5/19 at which time only 1.4 L was drained.  The patient has been compliant with his diuretic therapy which entails spironolactone 100 mg twice daily and Lasix 80 mg daily.    By report, the patient has experienced at least 3 upper GI bleeds, all of which were successfully controlled endoscopically with banding, the most recent episode occuring 11/21/2018.  The patient currently denies hematemesis, bloody or melanotic stools.  The patient reports issues with confusion in the past, which his wife states are worse at the time of activity, though she states he has been compliant with his prescribed dose of the lactulose 30 mL TID.  Calculated NaMELD score from laboratory values from April 3 was 15 suggesting patient could tolerate TIPS creation.  CT scan of the abdomen pelvis performed 10/13/2018 demonstrated hepatic anatomy amenable to potential percutaneous TIPS creation with patency of the portal vein and without definitive evidence of hepatocellular carcinoma.  As such, the risks and  benefits of TIPS and/or variceal embolization was discussed with the patient and his wife including, but not limited to, infection, bleeding, damage to adjacent structures, worsening hepatic and/or cardiac function, worsening mental status, non-target embolization and death.   I explained that as it appears his ascites is now fairly well controlled wound of his current diuretic regimen the TIPS creation would be performed primarily with the intent  of him suffering a future potentially life-threatening upper GI bleed.  I explained that my greatest concern for the procedure would be worsening postprocedural encephalopathy though I stated that this could be treated with a higher dose of lactulose and/or the addition of Xifaxan.  All of the patient's and the patient's wife's questions were answered, and following a prolonged a detailed conversation, they are agreeable to proceed.  As such, we will arrange to obtain the following: - Labs - CMP, CBC, INR, Ammonia, AFP - Imaging - BRTO protocol CT of the abdomen and pelvis -to both evaluate anatomy for procedural planning purposes as well as for hepatocellular carcinoma screening. - Cardiac Echo - to evaluate cardiac functionality to ensure he will tolerate potential increased cardiac loading following TIPS creation.  Above recommendations were discussed with the referring gastroenterologist, Dr. Dorrene German, who has elected to pursue a repeat upper endoscopy prior to definitive TIPS creation.  Pending results of above as well as post endoscopy discussion with Dr. Dorrene German, if patient is deemed a candidate for the TIPS, the procedure will be scheduled at [facility] hospital with general anesthesia.  The procedure will entail an overnight admission for continued observation.  The patient and the patient's wife know to call the interventional radiology clinic with any interval questions or concerns.  Thank you for this interesting consult.  I greatly  enjoyed meeting DIRON HADDON and look forward to participating in their care.  A copy of this report was sent to the requesting provider on this date.  Electronically Signed: Sandi Mariscal 02/10/2019, 9:22 AM   I spent a total of 30 Minutes in remote  clinical consultation, greater than 50% of which was counseling/coordinating care for TIPS creation.    Visit type: Audio and video (GoToMeeting).   Alternative for in-person consultation at [facility], [address], [place], [place]. This visit type was conducted due to national recommendations for restrictions regarding the COVID-19 Pandemic (e.g. social distancing).  This format is felt to be most appropriate for this patient at this time.  All issues noted in this document were discussed and addressed.

## 2019-02-19 ENCOUNTER — Other Ambulatory Visit: Payer: Self-pay | Admitting: *Deleted

## 2019-02-19 DIAGNOSIS — K7581 Nonalcoholic steatohepatitis (NASH): Secondary | ICD-10-CM

## 2019-03-05 ENCOUNTER — Other Ambulatory Visit: Payer: Medicare HMO

## 2019-03-11 ENCOUNTER — Other Ambulatory Visit: Payer: Self-pay | Admitting: Interventional Radiology

## 2019-03-12 ENCOUNTER — Other Ambulatory Visit: Payer: Self-pay | Admitting: Interventional Radiology

## 2019-03-30 ENCOUNTER — Other Ambulatory Visit: Payer: Self-pay | Admitting: *Deleted

## 2019-03-30 DIAGNOSIS — K7460 Unspecified cirrhosis of liver: Secondary | ICD-10-CM

## 2019-03-30 DIAGNOSIS — K7581 Nonalcoholic steatohepatitis (NASH): Secondary | ICD-10-CM

## 2020-08-21 ENCOUNTER — Inpatient Hospital Stay (HOSPITAL_COMMUNITY)
Admission: EM | Admit: 2020-08-21 | Discharge: 2020-08-24 | DRG: 442 | Disposition: A | Discharge status: Home Health | Payer: Medicare HMO | Attending: Internal Medicine | Admitting: Internal Medicine

## 2020-08-21 ENCOUNTER — Emergency Department (HOSPITAL_COMMUNITY): Payer: Medicare HMO

## 2020-08-21 ENCOUNTER — Inpatient Hospital Stay (HOSPITAL_COMMUNITY): Payer: Medicare HMO

## 2020-08-21 DIAGNOSIS — R339 Retention of urine, unspecified: Secondary | ICD-10-CM | POA: Diagnosis not present

## 2020-08-21 DIAGNOSIS — K7682 Hepatic encephalopathy: Secondary | ICD-10-CM | POA: Diagnosis present

## 2020-08-21 DIAGNOSIS — D649 Anemia, unspecified: Secondary | ICD-10-CM | POA: Diagnosis present

## 2020-08-21 DIAGNOSIS — E785 Hyperlipidemia, unspecified: Secondary | ICD-10-CM | POA: Diagnosis present

## 2020-08-21 DIAGNOSIS — K7290 Hepatic failure, unspecified without coma: Secondary | ICD-10-CM | POA: Diagnosis present

## 2020-08-21 DIAGNOSIS — M109 Gout, unspecified: Secondary | ICD-10-CM | POA: Diagnosis present

## 2020-08-21 DIAGNOSIS — Z888 Allergy status to other drugs, medicaments and biological substances status: Secondary | ICD-10-CM

## 2020-08-21 DIAGNOSIS — Z20822 Contact with and (suspected) exposure to covid-19: Secondary | ICD-10-CM | POA: Diagnosis present

## 2020-08-21 DIAGNOSIS — E119 Type 2 diabetes mellitus without complications: Secondary | ICD-10-CM | POA: Diagnosis present

## 2020-08-21 DIAGNOSIS — K7460 Unspecified cirrhosis of liver: Secondary | ICD-10-CM | POA: Diagnosis present

## 2020-08-21 DIAGNOSIS — Z794 Long term (current) use of insulin: Secondary | ICD-10-CM

## 2020-08-21 DIAGNOSIS — K766 Portal hypertension: Secondary | ICD-10-CM | POA: Diagnosis present

## 2020-08-21 DIAGNOSIS — Z79899 Other long term (current) drug therapy: Secondary | ICD-10-CM | POA: Diagnosis not present

## 2020-08-21 DIAGNOSIS — M5136 Other intervertebral disc degeneration, lumbar region: Secondary | ICD-10-CM | POA: Diagnosis present

## 2020-08-21 DIAGNOSIS — D6959 Other secondary thrombocytopenia: Secondary | ICD-10-CM | POA: Diagnosis present

## 2020-08-21 DIAGNOSIS — R188 Other ascites: Secondary | ICD-10-CM | POA: Diagnosis present

## 2020-08-21 DIAGNOSIS — I10 Essential (primary) hypertension: Secondary | ICD-10-CM | POA: Diagnosis present

## 2020-08-21 DIAGNOSIS — K7200 Acute and subacute hepatic failure without coma: Principal | ICD-10-CM | POA: Diagnosis present

## 2020-08-21 DIAGNOSIS — S41112A Laceration without foreign body of left upper arm, initial encounter: Secondary | ICD-10-CM

## 2020-08-21 DIAGNOSIS — D696 Thrombocytopenia, unspecified: Secondary | ICD-10-CM

## 2020-08-21 DIAGNOSIS — K7581 Nonalcoholic steatohepatitis (NASH): Secondary | ICD-10-CM | POA: Diagnosis present

## 2020-08-21 DIAGNOSIS — N179 Acute kidney failure, unspecified: Secondary | ICD-10-CM | POA: Diagnosis present

## 2020-08-21 DIAGNOSIS — Z7982 Long term (current) use of aspirin: Secondary | ICD-10-CM | POA: Diagnosis not present

## 2020-08-21 DIAGNOSIS — F329 Major depressive disorder, single episode, unspecified: Secondary | ICD-10-CM | POA: Diagnosis present

## 2020-08-21 DIAGNOSIS — Z880 Allergy status to penicillin: Secondary | ICD-10-CM

## 2020-08-21 DIAGNOSIS — Z7984 Long term (current) use of oral hypoglycemic drugs: Secondary | ICD-10-CM | POA: Diagnosis not present

## 2020-08-21 LAB — CBC WITH DIFFERENTIAL/PLATELET
Abs Immature Granulocytes: 0.02 10*3/uL (ref 0.00–0.07)
Basophils Absolute: 0 10*3/uL (ref 0.0–0.1)
Basophils Relative: 1 %
Eosinophils Absolute: 0.1 10*3/uL (ref 0.0–0.5)
Eosinophils Relative: 3 %
HCT: 33.2 % — ABNORMAL LOW (ref 39.0–52.0)
Hemoglobin: 11 g/dL — ABNORMAL LOW (ref 13.0–17.0)
Immature Granulocytes: 1 %
Lymphocytes Relative: 27 %
Lymphs Abs: 0.9 10*3/uL (ref 0.7–4.0)
MCH: 31.8 pg (ref 26.0–34.0)
MCHC: 33.1 g/dL (ref 30.0–36.0)
MCV: 96 fL (ref 80.0–100.0)
Monocytes Absolute: 0.3 10*3/uL (ref 0.1–1.0)
Monocytes Relative: 9 %
Neutro Abs: 2.1 10*3/uL (ref 1.7–7.7)
Neutrophils Relative %: 59 %
Platelets: 66 10*3/uL — ABNORMAL LOW (ref 150–400)
RBC: 3.46 MIL/uL — ABNORMAL LOW (ref 4.22–5.81)
RDW: 15.9 % — ABNORMAL HIGH (ref 11.5–15.5)
WBC: 3.5 10*3/uL — ABNORMAL LOW (ref 4.0–10.5)
nRBC: 0 % (ref 0.0–0.2)

## 2020-08-21 LAB — COMPREHENSIVE METABOLIC PANEL
ALT: 38 U/L (ref 0–44)
AST: 57 U/L — ABNORMAL HIGH (ref 15–41)
Albumin: 2.5 g/dL — ABNORMAL LOW (ref 3.5–5.0)
Alkaline Phosphatase: 103 U/L (ref 38–126)
Anion gap: 8 (ref 5–15)
BUN: 23 mg/dL (ref 8–23)
CO2: 23 mmol/L (ref 22–32)
Calcium: 8.5 mg/dL — ABNORMAL LOW (ref 8.9–10.3)
Chloride: 107 mmol/L (ref 98–111)
Creatinine, Ser: 1.7 mg/dL — ABNORMAL HIGH (ref 0.61–1.24)
GFR, Estimated: 43 mL/min — ABNORMAL LOW (ref >60)
Glucose, Bld: 100 mg/dL — ABNORMAL HIGH (ref 70–99)
Potassium: 3.8 mmol/L (ref 3.5–5.1)
Sodium: 138 mmol/L (ref 135–145)
Total Bilirubin: 1.6 mg/dL — ABNORMAL HIGH (ref 0.3–1.2)
Total Protein: 6.1 g/dL — ABNORMAL LOW (ref 6.5–8.1)

## 2020-08-21 LAB — TROPONIN I (HIGH SENSITIVITY)
Troponin I (High Sensitivity): 7 ng/L (ref <18)
Troponin I (High Sensitivity): 7 ng/L (ref <18)

## 2020-08-21 LAB — RAPID URINE DRUG SCREEN, HOSP PERFORMED
Amphetamines: NOT DETECTED
Barbiturates: NOT DETECTED
Benzodiazepines: NOT DETECTED
Cocaine: NOT DETECTED
Opiates: NOT DETECTED
Tetrahydrocannabinol: NOT DETECTED

## 2020-08-21 LAB — URINALYSIS, ROUTINE W REFLEX MICROSCOPIC
Bilirubin Urine: NEGATIVE
Glucose, UA: NEGATIVE mg/dL
Hgb urine dipstick: NEGATIVE
Ketones, ur: NEGATIVE mg/dL
Leukocytes,Ua: NEGATIVE
Nitrite: NEGATIVE
Protein, ur: NEGATIVE mg/dL
Specific Gravity, Urine: 1.013 (ref 1.005–1.030)
pH: 6 (ref 5.0–8.0)

## 2020-08-21 LAB — HEMOGLOBIN A1C
Hgb A1c MFr Bld: 5.6 % (ref 4.8–5.6)
Mean Plasma Glucose: 114.02 mg/dL

## 2020-08-21 LAB — RESP PANEL BY RT-PCR (FLU A&B, COVID) ARPGX2
Influenza A by PCR: NEGATIVE
Influenza B by PCR: NEGATIVE
SARS Coronavirus 2 by RT PCR: NEGATIVE

## 2020-08-21 LAB — AMMONIA: Ammonia: 103 umol/L — ABNORMAL HIGH (ref 9–35)

## 2020-08-21 LAB — ETHANOL: Alcohol, Ethyl (B): <10 mg/dL (ref <10)

## 2020-08-21 LAB — PROTIME-INR
INR: 1.3 — ABNORMAL HIGH (ref 0.8–1.2)
Prothrombin Time: 16 seconds — ABNORMAL HIGH (ref 11.4–15.2)

## 2020-08-21 LAB — CBG MONITORING, ED
Glucose-Capillary: 88 mg/dL (ref 70–99)
Glucose-Capillary: 98 mg/dL (ref 70–99)

## 2020-08-21 LAB — CK: Total CK: 115 U/L (ref 49–397)

## 2020-08-21 LAB — LIPASE, BLOOD: Lipase: 32 U/L (ref 11–51)

## 2020-08-21 LAB — LACTIC ACID, PLASMA: Lactic Acid, Venous: 2.1 mmol/L (ref 0.5–1.9)

## 2020-08-21 LAB — POC OCCULT BLOOD, ED: Fecal Occult Bld: NEGATIVE

## 2020-08-21 MED ORDER — PANTOPRAZOLE SODIUM 40 MG IV SOLR
40.0000 mg | Freq: Two times a day (BID) | INTRAVENOUS | Status: DC
  Administered 2020-08-21: 23:00:00 40 mg via INTRAVENOUS
  Filled 2020-08-21: qty 40

## 2020-08-21 MED ORDER — LACTULOSE ENEMA
300.0000 mL | Freq: Three times a day (TID) | ORAL | Status: DC
  Administered 2020-08-22: 300 mL via RECTAL
  Filled 2020-08-21 (×5): qty 300

## 2020-08-21 MED ORDER — INSULIN ASPART 100 UNIT/ML ~~LOC~~ SOLN
0.0000 [IU] | Freq: Three times a day (TID) | SUBCUTANEOUS | Status: DC
  Administered 2020-08-22: 17:00:00 2 [IU] via SUBCUTANEOUS
  Administered 2020-08-23: 18:00:00 1 [IU] via SUBCUTANEOUS
  Administered 2020-08-24: 09:00:00 3 [IU] via SUBCUTANEOUS

## 2020-08-21 MED ORDER — CIPROFLOXACIN IN D5W 400 MG/200ML IV SOLN
400.0000 mg | Freq: Two times a day (BID) | INTRAVENOUS | Status: DC
  Administered 2020-08-21 – 2020-08-23 (×4): 400 mg via INTRAVENOUS
  Filled 2020-08-21 (×4): qty 200

## 2020-08-21 MED ORDER — SODIUM CHLORIDE 0.9 % IV SOLN
50.0000 ug/h | INTRAVENOUS | Status: DC
  Administered 2020-08-21 – 2020-08-22 (×2): 50 ug/h via INTRAVENOUS
  Filled 2020-08-21 (×3): qty 1

## 2020-08-21 MED ORDER — SODIUM CHLORIDE 0.9% FLUSH
3.0000 mL | Freq: Two times a day (BID) | INTRAVENOUS | Status: DC
  Administered 2020-08-22 – 2020-08-23 (×3): 3 mL via INTRAVENOUS

## 2020-08-21 MED ORDER — OCTREOTIDE LOAD VIA INFUSION
50.0000 ug | Freq: Once | INTRAVENOUS | Status: AC
  Administered 2020-08-21: 21:00:00 50 ug via INTRAVENOUS
  Filled 2020-08-21 (×4): qty 25

## 2020-08-21 NOTE — ED Notes (Signed)
Ammonia on ice sent down to lab at 1430

## 2020-08-21 NOTE — ED Notes (Signed)
Pt has a skin tear to left upper extremity. PT uncooperative and combative.

## 2020-08-21 NOTE — Hospital Course (Addendum)
#   Hepatic Encephalopathy, Type C, Grade III  # Cirrhosis 2/2 NASH In the setting of medication non-compliance most likely, as 90 day refill of Lactulose last filled in July 2021. He should have run out in September/October. He is taking his Rifaximin though.  MELD score: 16  Octreotide, Ciprofloxacin, IV PPI ??? Lactulose Enemas q8h Abdominal Ultrasound to evaluate for ascites. Possible diagnostic and therapeutic paracentesis tomorrow  May need Seroquel vs Haldol for agitation at night  GI consult tomorrow possibly.  Blood cultures pending CLIF-SOFA score of 7.  Last paracentesis 08/09/2020 - 4.5 L off. Apparently called on 12/23 GI office requesting additional paracentesis.  On spironolactone 100 mg and Lasix 80 daily. Hold given AKI.   # Thrombocytopenia  Platelets in November 2021: PLT 69* 72* 69* 70*   # Normocytic Anemia in the setting of recent UGIB 2/2 AVMs  Hemoglobin in November 2021: HGB 10.9* 11.3* 10.4* 9.9*   # Type 2 Diabetes Mellitus    # Hypertension  Amlodipine 10 mg, Imdur 30 daily listed but not currently taking it. Holding anyway given normotensive and high risk for hypotension.      Separated since September.   Short term memory loss?  Usually around 8:30-9 am, comes to the house for breakfast. If he doesn't, his brother checks on him. Mumbling,

## 2020-08-21 NOTE — ED Triage Notes (Signed)
Patient arrived by [facility] after being found altered this am by brother, patient lives in patients garage. Patient will open eyes for brief second with sternal rub. Skin jaundiced and has hx of cirrhosis. Nonverbal on arrival

## 2020-08-21 NOTE — ED Notes (Signed)
Couldn't get temp

## 2020-08-21 NOTE — H&P (Signed)
Date: 08/21/2020               Patient Name:  Brad [name] MRN: [id number]  DOB: 08/22/1951 Age / Sex: 69 y.o., male   PCP: Glendon Axe, MD         Medical Service: Internal Medicine Teaching Service         Attending Physician: Dr. Rebeca Alert Raynaldo Opitz, MD    First Contact: Dr. Meredith Staggers Kaydan Wong Pager: [phone]  Second Contact: Dr. Jose Persia Pager: [phone]       After Hours (After 5p/  First Contact Pager: [phone]  weekends / holidays): Second Contact Pager: [phone]   Chief Complaint: Altered mental status  History of Present Illness: Mr. 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  Patient is unable to provide any history. Brother is called and as per brother, after separation pt. Lives with the brother but manages his own medications and he is not sure weather the patient is compliant or not. Brother makes and fills the pill box,but does not help prepare lactulose.This morning when pt. Did not show up at 8:30 am he went to check on him and found him lying on floor without pants and undergarments.   ED course: Pt. Brought in ambulance unresponsive, lethargic, BP 117/57, Troponin's 7, ammonia 103, FOBT negative, WBC 3.5, Hb 11, Platelets 66, Blood glucose 100, Bili 1.6, Lactic acid 2.1, Prothrombin 16, INR 1.3, COVID negative, Blood culture *2 pending, US abdomen shows small to moderate ascites, CT head does not show any acute abnormality, Chest xray shows mild vascular congestion without significant edema.   Meds:  Current Outpatient Medications  Medication Instructions  . allopurinol (ZYLOPRIM) 100 mg, Oral  . amLODipine (NORVASC) 10 mg, Oral, Daily  . aspirin EC 81 mg, Oral, Daily  . calcium carbonate (TUMS - DOSED IN MG ELEMENTAL CALCIUM) 500 MG chewable tablet 1-2 tablets, Oral, Daily PRN  .  canagliflozin (INVOKANA) 100 mg, Oral, Daily  . empagliflozin (JARDIANCE) 10 mg, Oral, Daily  . escitalopram (LEXAPRO) 10 mg, Oral, Daily  . fish oil-omega-3 fatty acids 1 g, Oral, Daily  . fluocinonide cream (LIDEX) 9.24 % 1 application, Topical, 2 times daily PRN  . furosemide (LASIX) 40 mg, Oral  . gabapentin (NEURONTIN) 600 mg, Oral, 3 times daily  . glipiZIDE (GLUCOTROL) 5 mg, Oral  . hydrOXYzine (ATARAX/VISTARIL) 25 mg, Oral  . ibuprofen (ADVIL) 400 mg, Oral, Daily PRN  . insulin degludec (TRESIBA FLEXTOUCH) 100 UNIT/ML FlexTouch Pen Subcutaneous, Daily  . insulin glargine (LANTUS) 50 Units, Subcutaneous, Daily  . isosorbide mononitrate (IMDUR) 30 mg, Oral, Daily  . lactulose (CHRONULAC) 30 g, Oral, 3 times daily  . liraglutide (VICTOZA) 1.8 mg, Subcutaneous, Daily at bedtime  . magnesium oxide (MAG-OX) 400 MG tablet 1 tablet, Oral, Daily  . Magnesium 250 mg, Oral, Daily at bedtime  . metFORMIN (GLUCOPHAGE) 1,000 mg, Oral, 2 times daily  . metoCLOPramide (REGLAN) 5 mg, Oral, Daily  . Multiple Vitamin (MULTIVITAMIN) capsule 1 capsule, Oral, Daily  . nitroGLYCERIN (NITROSTAT) 0.4 mg, Sublingual, Every 5 min x3 PRN  . Oxycodone HCl 10 mg, Oral, 3 times daily PRN  . pantoprazole (PROTONIX) 40 mg, Oral, Daily  . potassium chloride SA (KLOR-CON) 20 MEQ tablet 20 mEq, Oral  . Repatha SureClick 462 mg, Injection, Every 14  days, On every other sundays  . rifaximin (XIFAXAN) 550 mg, Oral  . Semaglutide,0.25 or 0.5MG/DOS, (OZEMPIC, 0.25 OR 0.5 MG/DOSE,) 2 MG/1.5ML SOPN Subcutaneous  . solifenacin (VESICARE) 5 mg, Oral, Daily at bedtime  . spironolactone (ALDACTONE) 100 mg, Oral  . tiotropium (SPIRIVA) 12 mcg, Inhalation, Daily  . triamcinolone (KENALOG) 0.1 % 1 application, Topical  . triamcinolone ointment (KENALOG) 0.1 % 1 application, Topical, 2 times daily PRN  . zolpidem (AMBIEN CR) 6.25-12.5 mg, Oral, At bedtime PRN       Allergies: Allergies as of 08/21/2020 - Review Complete  07/29/2017  Allergen Reaction Noted  . Lisinopril Swelling 06/07/2015  . Penicillins Swelling 12/28/2012   Past Medical History:  Diagnosis Date  . Cirrhosis of liver not due to alcohol ([place])   . DDD (degenerative disc disease), lumbar   . Diabetes mellitus without complication ([place])   . Gout   . Hypertension     Family History: Colon polyps in Mother, Lung Cancer in Father  Social History: Recently separated, lives with brother now.  Review of Systems: A complete ROS was negative except as per HPI.   Physical Exam: Blood pressure 115/65, pulse 65, temperature (!) 97.3 F (36.3 C), temperature source Rectal, resp. rate 14, SpO2 97 %.   General: Well developed, chronically ill male lying in bed, NAD HEENT:[place]/AT, PERRLA, EOMI, scleral icterus Neck: Soft, supple, no JVD Cardiovascular: RRR, S1, S2 heard, no m/r/g Respiratory: CTAB, no wheezes, no rales, wheezes, rhonci Abdominal: Soft, distended,  Skin: Warm, dry, L arm with ecchymosis Neuro: Lethargic, AAO*0, unable to follow commands Musculoskeletal: 1+ pitting edema in both lower extremities, mid-tibial Psych: Unable to access.   EKG: personally reviewed my interpretation is normal sinus rhythm.   CXR: personally reviewed my interpretation is mild vascular congestion without significant edema.  CT head:  IMPRESSION: 1. No acute intracranial abnormality. 2. Minimal motion degradation.  Assessment & Plan by Problem:  Mr. 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  Active Problems:   Hepatic encephalopathy (HCC)  # Hepatic Encephalopathy, Type C, Grade III  # Cirrhosis 2/2 NASH  Presented with acute encephalopathy in the setting of medication non-compliance, as 90 day refill of Lactulose last filled in July 2021 versus triggers such as  infection. He is taking his Rifaximin though.  Ammonia level is 103.   Last paracentesis 08/09/2020 - 4.5 L off. Apparently called on 12/23 GI office requesting additional paracentesis. Abdominal Ultrasound to evaluate for ascites and it shows small to medium ascites. Possible diagnostic and therapeutic paracentesis tomorrow, given concern for SBP.  On spironolactone 100 mg and Lasix 80 daily. Hold given AKI.  MELD score is 16, estimated 33-monthmortality. CLIF-SOFA score of 7.   --Lactulose Enemas q8h --Octreotide  --Ciprofloxacin for empiric SBP t/t --IV PPI BID --May need Seroquel vs Haldol for agitation at night  --Consider GI consult  --Blood cultures pending  #AKI  SCr~1.70, BUN 23, baseline 0.80-1.10, GFR 43. Can be pre-renal, hepato-renal?? On spironolactone 100 mg and Lasix 80 daily. Hold given AKI.   --F/U BMP  # Thrombocytopenia  Due to his chronic liver disease platelets count in November 2021: PLT 69* 72* 69* 70*. Current platelets 66  -F/u CBC   # Normocytic Anemia in the setting of recent UGIB 2/2 AVMs  Hemoglobin in November 2021:  HGB 10.9* 11.3* 10.4* 9.9* . Current Hb 11.0. FOBT negative.  -F/u CBC  # Type 2 Diabetes Mellitus  Blood glucose is 100 on admission  -F/U HbA1c -CBG monitoring -SSI sensitive  # Gout -Hold Allopurinol until tolerates PO  # Hypertension  Amlodipine 10 mg, Imdur 30 daily listed but not currently taking it. Holding  given normotensive and high risk for hypotension.   Dispo: Admit patient to Inpatient with expected length of stay greater than 2 midnights.  Signed: Honor Junes, MD 08/21/2020, 6:21 PM  Pager: [phone] After 5pm on weekdays and 1pm on weekends: On Call pager: [phone]

## 2020-08-21 NOTE — ED Notes (Signed)
Warm blanket applied to patient.

## 2020-08-21 NOTE — ED Notes (Signed)
Patient transported to Ultrasound 

## 2020-08-21 NOTE — ED Notes (Signed)
Dr. Charleen Kirks advised to leave foley in place at this time.

## 2020-08-21 NOTE — ED Notes (Signed)
Order being placed for enema bag.

## 2020-08-21 NOTE — ED Provider Notes (Signed)
[facility] EMERGENCY DEPARTMENT Provider Note   CSN: [id number] Arrival date & time: 08/21/20  1111     History No chief complaint on file.   Brad [name] is a 69 y.o. male.  Level 5 caveat secondary to altered mental status.  Patient brought in by ambulance after his brother found him unresponsive in his apartment.  History of cirrhosis.  No other history available at this time  The history is provided by the EMS personnel and a relative.  Altered Mental Status Presenting symptoms: unresponsiveness   Severity:  Severe Episode history:  Unable to specify Timing:  Unable to specify Progression:  Unchanged      Past Medical History:  Diagnosis Date  . Cirrhosis of liver not due to alcohol ([place])   . DDD (degenerative disc disease), lumbar   . Diabetes mellitus without complication ([place])   . Gout   . Hypertension     Patient Active Problem List   Diagnosis Date Noted  . Abnormal stress test 07/24/2017  . Anginal chest pain at rest [facility]) 07/24/2017  . Benign essential hypertension 11/09/2015  . Cirrhosis ([place]) 11/09/2015  . Hyperlipidemia 11/09/2015  . Insulin long-term use ([place]) 11/09/2015  . OSA (obstructive sleep apnea) 11/09/2015  . Uncontrolled type 2 diabetes mellitus without complication, with long-term current use of insulin 11/09/2015  . 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 Neck pain on left side 04/28/2013    Past Surgical History:  Procedure Laterality Date  . ADENOIDECTOMY    . ANKLE SURGERY    . CHOLECYSTECTOMY    . IR RADIOLOGIST EVAL & MGMT  02/10/2019  . LEFT HEART CATH AND CORONARY ANGIOGRAPHY N/A 07/29/2017   Procedure: LEFT HEART CATH AND CORONARY ANGIOGRAPHY;  Surgeon: Lorretta Harp, MD;  Location: [facility] CV LAB;  Service: Cardiovascular;  Laterality: N/A;  . TONSILLECTOMY         No family history on file.  Social History   Tobacco Use  . Smoking status: Former Research scientist (life sciences)  . Smokeless tobacco: Never Used  Substance Use  Topics  . Alcohol use: No  . Drug use: No    Home Medications Prior to Admission medications   Medication Sig Start Date End Date Taking? Authorizing Provider  allopurinol (ZYLOPRIM) 100 MG tablet Take 100 mg by mouth daily.    [provider]  amLODipine (NORVASC) 10 MG tablet Take 10 mg by mouth daily.    [provider]  aspirin EC 81 MG tablet Take 81 mg by mouth daily.    [provider]  calcium carbonate (TUMS - DOSED IN MG ELEMENTAL CALCIUM) 500 MG chewable tablet Chew 1-2 tablets by mouth daily as needed for indigestion or heartburn.    [provider]  canagliflozin (INVOKANA) 100 MG TABS tablet Take 100 mg by mouth daily. 07/04/17   [provider]  fish oil-omega-3 fatty acids 1000 MG capsule Take 1 g by mouth daily.     [provider]  fluocinonide cream (LIDEX) 1.02 % Apply 1 application topically 2 (two) times daily as needed for itching. 06/20/17   [provider]  furosemide (LASIX) 80 MG tablet Take 80 mg by mouth daily. 06/06/17   [provider]  gabapentin (NEURONTIN) 600 MG tablet Take 600 mg by mouth 3 (three) times daily. 06/26/17   [provider]  glipiZIDE (GLUCOTROL) 5 MG tablet Take 5 mg by mouth daily.     [provider]  hydrOXYzine (ATARAX/VISTARIL) 25 MG tablet  Take 25 mg by mouth 3 (three) times daily.     [provider]  ibuprofen (ADVIL,MOTRIN) 200 MG tablet Take 400 mg by mouth daily as needed for headache or moderate pain.    [provider]  insulin glargine (LANTUS) 100 UNIT/ML injection Inject 50 Units into the skin daily.    [provider]  isosorbide mononitrate (IMDUR) 30 MG 24 hr tablet Take 30 mg by mouth daily.    [provider]  liraglutide (VICTOZA) 18 MG/3ML SOPN Inject 1.8 mg into the skin at bedtime.  12/20/16   [provider]  Magnesium 250 MG TABS Take 250 mg by mouth at bedtime.     [provider]  metFORMIN (GLUCOPHAGE) 500 MG tablet Take 1,000 mg by mouth 2 (two) times daily.     [provider]  metoCLOPramide (REGLAN) 5 MG tablet Take 5 mg by mouth daily.     [provider]  Multiple Vitamin (MULTIVITAMIN) capsule Take 1 capsule by mouth daily.    [provider]  nitroGLYCERIN (NITROSTAT) 0.4 MG SL tablet Place 0.4 mg under the tongue every 5 (five) minutes x 3 doses as needed for chest pain.  07/12/17   [provider]  Oxycodone HCl 10 MG TABS Take 10 mg by mouth 3 (three) times daily as needed for pain. 06/26/17   [provider]  pantoprazole (PROTONIX) 40 MG tablet Take 40 mg by mouth daily.    [provider]  REPATHA SURECLICK 606 MG/ML SOAJ Inject 140 mg as directed every 14 (fourteen) days. On every other sundays 07/19/17   [provider]  solifenacin (VESICARE) 5 MG tablet Take 5 mg by mouth at bedtime.     [provider]  spironolactone (ALDACTONE) 100 MG tablet Take 100 mg by mouth daily. 07/22/17   [provider]  tiotropium (SPIRIVA HANDIHALER) 18 MCG inhalation capsule Place 12 mcg into inhaler and inhale daily. 05/31/15   [provider]  triamcinolone ointment (KENALOG) 0.1 % Apply 1 application topically 2 (two) times daily as needed for itching. 06/13/17   [provider]  zolpidem (AMBIEN CR) 6.25 MG CR tablet Take 6.25-12.5 mg by mouth at bedtime as needed for sleep. 06/26/17   [provider]    Allergies    Lisinopril and Penicillins  Review of Systems   Review of Systems  Unable to perform ROS: Mental status change    Physical Exam Updated Vital Signs BP (!) 117/57   Pulse 64   Resp 15   SpO2 96%   Physical Exam Vitals and nursing note reviewed.  Constitutional:      Appearance: He is well-developed and well-nourished.  HENT:     Head: Normocephalic and atraumatic.  Eyes:     Conjunctiva/sclera: Conjunctivae normal.   Cardiovascular:     Rate and Rhythm: Normal rate and regular rhythm.     Heart sounds: No murmur heard.   Pulmonary:     Effort: Pulmonary effort is normal. No respiratory distress.     Breath sounds: Normal breath sounds.  Abdominal:     Palpations: Abdomen is soft.     Tenderness: There is no abdominal tenderness.  Musculoskeletal:        General: Signs of injury present.     Cervical back: Neck supple.     Right lower leg: Edema present.     Left lower leg: Edema present.     Comments: Skin tear left upper arm.  Full range of motion of all extremities without any limitations.  Skin:    General: Skin is warm and dry.  Neurological:     Mental Status: He is lethargic.     GCS: GCS eye subscore is 3. GCS verbal subscore is 3. GCS motor subscore is 5.     Comments: Patient will open eyes to voice.  Seems to be moving all extremities but not following commands.  Not answering any questions.  Psychiatric:        Mood and Affect: Mood and affect normal.     ED Results / Procedures / Treatments   Labs (all labs ordered are listed, but only abnormal results are displayed) Labs Reviewed  URINE CULTURE  CULTURE, BLOOD (ROUTINE X 2)  CULTURE, BLOOD (ROUTINE X 2)  CBC WITH DIFFERENTIAL/PLATELET  COMPREHENSIVE METABOLIC PANEL  URINALYSIS, ROUTINE W REFLEX MICROSCOPIC  LACTIC ACID, PLASMA  PROTIME-INR  AMMONIA  CK  LIPASE, BLOOD  ETHANOL  RAPID URINE DRUG SCREEN, HOSP PERFORMED  CBG MONITORING, ED  TROPONIN I (HIGH SENSITIVITY)    EKG EKG Interpretation  Date/Time:  Sunday August 21 2020 11:26:08 EST Ventricular Rate:  64 PR Interval:    QRS Duration: 127 QT Interval:  506 QTC Calculation: 523 R Axis:   9 Text Interpretation: Sinus rhythm Nonspecific intraventricular conduction delay lower voltage since prior 12/18 Confirmed by Aletta Edouard [phone]) on 08/21/2020 11:28:33 AM   Radiology No results found.  Procedures .Critical Care Performed by: Hayden Rasmussen, MD Authorized by: Hayden Rasmussen, MD   Critical care provider statement:    Critical care time (minutes):  45   Critical care time was exclusive of:  Separately billable procedures and treating other patients   Critical care was necessary to treat or prevent imminent or life-threatening deterioration of the following conditions:  CNS failure or compromise and metabolic crisis   Critical care was time spent personally by me on the following activities:  Discussions with consultants, evaluation of patient's response to treatment, examination of patient, ordering and performing treatments and interventions, ordering and review of laboratory studies, ordering and review of radiographic studies, pulse oximetry, re-evaluation of patient's condition, obtaining history from patient or surrogate, review of old charts and development of treatment plan with patient or surrogate   (including critical care time)  Medications Ordered in ED Medications  sodium chloride flush (NS) 0.9 % injection 3 mL (has no administration in time range)  octreotide (SANDOSTATIN) 2 mcg/mL load via infusion 50 mcg (50 mcg Intravenous Bolus from Bag 08/21/20 2053)    And  octreotide (SANDOSTATIN) 500 mcg in sodium chloride 0.9 % 250 mL (2 mcg/mL) infusion (50 mcg/hr Intravenous New Bag/Given 08/22/20 0605)  ciprofloxacin (CIPRO) IVPB 400 mg (400 mg Intravenous New Bag/Given 08/22/20 0555)  pantoprazole (PROTONIX) injection 40 mg (40 mg Intravenous Given 08/21/20 2312)  insulin aspart (novoLOG) injection 0-9 Units (0 Units Subcutaneous Not Given 08/22/20 0741)  Chlorhexidine Gluconate Cloth 2 % PADS 6 each (has no administration in time range)  haloperidol lactate (HALDOL) injection 5 mg (has no administration in time range)  lactulose (CHRONULAC) enema 200 gm (has no administration in time range)  haloperidol lactate (HALDOL) injection 1 mg (1 mg Intravenous Given 08/22/20 0550)    ED Course  I have reviewed  the triage vital signs and the nursing notes.  Pertinent labs & imaging results that were available during my care of the patient were reviewed by me and considered in my medical decision making (  see chart for details).  Clinical Course as of 08/22/20 0841  Sun Aug 21, 2020  1136 Chest x-ray with no gross infiltrates interpreted by me.  Awaiting radiology reading. [MB]  1204 I was able to reach the patient's brother Brad [name] at [phone].  He states that the patient and his wife split in September and he has been living in a garage at his (brother's) place.  He is not sure if he is taking his medicines correctly.  Last known well was last evening. [MB]  1245 Rectal exam done with tech Brad [name] as chaperone.  Normal tone minimal stool in vault.  Sent for guaiac. [MB]  6213 Discussed with physician from the IM teaching service.  They will evaluate the patient for admission. [MB]    Clinical Course User Index [MB] Hayden Rasmussen, MD   MDM Rules/Calculators/A&P                          This patient complains of altered mental status; this involves an extensive number of treatment Options and is a complaint that carries with it a high risk of complications and Morbidity. The differential includes sepsis, Sirs, bleed, stroke encephalopathy, liver failure, GI bleed, metabolic derangement, hypoglycemia, rhabdomyolysis  I ordered, reviewed and interpreted labs, which included CBC with slightly depressed white count, hemoglobin lower than baseline, chemistries with elevated creatinine, coags slightly elevated, ammonia elevated, lactic acid slightly elevated, alcohol negative, Covid testing negative, Covid testing negative  I ordered medication IV fluids I ordered imaging studies which included chest x-ray and CT head and I independently    visualized and interpreted imaging which showed no acute findings Additional history obtained from EMS and patient's brother Previous records obtained and reviewed in  epic, was admitted to [facility] last month for hepatic encephalopathy I consulted internal medicine teaching service and discussed lab and imaging findings  Critical Interventions: Work-up and management of patient's altered mental status  After the interventions stated above, I reevaluated the patient and found patient still to be arousable to voice.  No indication for intubation at this time.  He will need to be admitted to the hospital for further management of his hepatic encephalopathy.  Brad [name] was evaluated in Emergency Department on 08/21/2020 for the symptoms described in the history of present illness. He was evaluated in the context of the global COVID-19 pandemic, which necessitated consideration that the patient might be at risk for infection with the SARS-CoV-2 virus that causes COVID-19. Institutional protocols and algorithms that pertain to the evaluation of patients at risk for COVID-19 are in a state of rapid change based on information released by regulatory bodies including the CDC and federal and state organizations. These policies and algorithms were followed during the patient's care in the ED.   Final Clinical Impression(s) / ED Diagnoses Final diagnoses:  Hepatic encephalopathy ([place])  AKI (acute kidney injury) ([place])  Thrombocytopenia ([place])  Skin tear of left upper arm without complication, initial encounter  Urinary retention    Rx / DC Orders ED Discharge Orders    None       Hayden Rasmussen, MD 08/22/20 [id number]

## 2020-08-21 NOTE — ED Notes (Signed)
Pt is at Ultrasound

## 2020-08-21 NOTE — ED Notes (Signed)
Report given to floor Truman Hayward, RN.

## 2020-08-22 ENCOUNTER — Inpatient Hospital Stay (HOSPITAL_COMMUNITY): Payer: Medicare HMO

## 2020-08-22 DIAGNOSIS — K7290 Hepatic failure, unspecified without coma: Secondary | ICD-10-CM

## 2020-08-22 HISTORY — PX: IR PARACENTESIS: IMG2679

## 2020-08-22 LAB — URINE CULTURE: Culture: NO GROWTH

## 2020-08-22 LAB — BODY FLUID CELL COUNT WITH DIFFERENTIAL
Eos, Fluid: 0 %
Lymphs, Fluid: 52 %
Monocyte-Macrophage-Serous Fluid: 40 % — ABNORMAL LOW (ref 50–90)
Neutrophil Count, Fluid: 8 % (ref 0–25)
Total Nucleated Cell Count, Fluid: 126 cu mm (ref 0–1000)

## 2020-08-22 LAB — PROTIME-INR
INR: 1.4 — ABNORMAL HIGH (ref 0.8–1.2)
Prothrombin Time: 16.3 seconds — ABNORMAL HIGH (ref 11.4–15.2)

## 2020-08-22 LAB — COMPREHENSIVE METABOLIC PANEL
ALT: 39 U/L (ref 0–44)
AST: 65 U/L — ABNORMAL HIGH (ref 15–41)
Albumin: 2.4 g/dL — ABNORMAL LOW (ref 3.5–5.0)
Alkaline Phosphatase: 102 U/L (ref 38–126)
Anion gap: 9 (ref 5–15)
BUN: 25 mg/dL — ABNORMAL HIGH (ref 8–23)
CO2: 22 mmol/L (ref 22–32)
Calcium: 8.6 mg/dL — ABNORMAL LOW (ref 8.9–10.3)
Chloride: 107 mmol/L (ref 98–111)
Creatinine, Ser: 1.48 mg/dL — ABNORMAL HIGH (ref 0.61–1.24)
GFR, Estimated: 51 mL/min — ABNORMAL LOW (ref >60)
Glucose, Bld: 68 mg/dL — ABNORMAL LOW (ref 70–99)
Potassium: 4 mmol/L (ref 3.5–5.1)
Sodium: 138 mmol/L (ref 135–145)
Total Bilirubin: 3.8 mg/dL — ABNORMAL HIGH (ref 0.3–1.2)
Total Protein: 6.1 g/dL — ABNORMAL LOW (ref 6.5–8.1)

## 2020-08-22 LAB — CBC WITH DIFFERENTIAL/PLATELET
Abs Immature Granulocytes: 0.03 10*3/uL (ref 0.00–0.07)
Basophils Absolute: 0.1 10*3/uL (ref 0.0–0.1)
Basophils Relative: 1 %
Eosinophils Absolute: 0.2 10*3/uL (ref 0.0–0.5)
Eosinophils Relative: 3 %
HCT: 32.5 % — ABNORMAL LOW (ref 39.0–52.0)
Hemoglobin: 11.6 g/dL — ABNORMAL LOW (ref 13.0–17.0)
Immature Granulocytes: 1 %
Lymphocytes Relative: 21 %
Lymphs Abs: 1.2 10*3/uL (ref 0.7–4.0)
MCH: 33 pg (ref 26.0–34.0)
MCHC: 35.7 g/dL (ref 30.0–36.0)
MCV: 92.3 fL (ref 80.0–100.0)
Monocytes Absolute: 0.4 10*3/uL (ref 0.1–1.0)
Monocytes Relative: 7 %
Neutro Abs: 4 10*3/uL (ref 1.7–7.7)
Neutrophils Relative %: 67 %
Platelets: 78 10*3/uL — ABNORMAL LOW (ref 150–400)
RBC: 3.52 MIL/uL — ABNORMAL LOW (ref 4.22–5.81)
RDW: 15.7 % — ABNORMAL HIGH (ref 11.5–15.5)
WBC: 5.9 10*3/uL (ref 4.0–10.5)
nRBC: 0 % (ref 0.0–0.2)

## 2020-08-22 LAB — GLUCOSE, CAPILLARY
Glucose-Capillary: 73 mg/dL (ref 70–99)
Glucose-Capillary: 85 mg/dL (ref 70–99)
Glucose-Capillary: 166 mg/dL — ABNORMAL HIGH (ref 70–99)
Glucose-Capillary: 129 mg/dL — ABNORMAL HIGH (ref 70–99)

## 2020-08-22 LAB — GRAM STAIN

## 2020-08-22 LAB — ALBUMIN, PLEURAL OR PERITONEAL FLUID: Albumin, Fluid: <1 g/dL

## 2020-08-22 LAB — HIV ANTIBODY (ROUTINE TESTING W REFLEX): HIV Screen 4th Generation wRfx: NONREACTIVE

## 2020-08-22 MED ORDER — LIDOCAINE HCL 1 % IJ SOLN
INTRAMUSCULAR | Status: AC
  Filled 2020-08-22: qty 20

## 2020-08-22 MED ORDER — HALOPERIDOL LACTATE 5 MG/ML IJ SOLN: 5.0000 mg | Freq: Once | INTRAMUSCULAR | Status: DC

## 2020-08-22 MED ORDER — CHLORHEXIDINE GLUCONATE CLOTH 2 % EX PADS
6.0000 | MEDICATED_PAD | Freq: Every day | CUTANEOUS | Status: DC
  Administered 2020-08-22 – 2020-08-23 (×2): 6 via TOPICAL

## 2020-08-22 MED ORDER — LACTULOSE ENEMA
300.0000 mL | ORAL | Status: DC
  Administered 2020-08-22: 300 mL via RECTAL
  Filled 2020-08-22 (×2): qty 300

## 2020-08-22 MED ORDER — TIOTROPIUM BROMIDE MONOHYDRATE 18 MCG IN CAPS: 18.0000 ug | ORAL_CAPSULE | Freq: Every day | RESPIRATORY_TRACT | Status: DC

## 2020-08-22 MED ORDER — LIDOCAINE HCL (PF) 1 % IJ SOLN
INTRAMUSCULAR | Status: AC | PRN
Start: 2020-08-22 — End: 2020-08-22
  Administered 2020-08-22: 10 mL

## 2020-08-22 MED ORDER — RIFAXIMIN 550 MG PO TABS
550.0000 mg | ORAL_TABLET | Freq: Two times a day (BID) | ORAL | Status: DC
  Administered 2020-08-22 – 2020-08-24 (×5): 550 mg via ORAL
  Filled 2020-08-22 (×6): qty 1

## 2020-08-22 MED ORDER — UMECLIDINIUM BROMIDE 62.5 MCG/INH IN AEPB
1.0000 | INHALATION_SPRAY | Freq: Every day | RESPIRATORY_TRACT | Status: DC
  Filled 2020-08-22: qty 7

## 2020-08-22 MED ORDER — LACTULOSE 10 GM/15ML PO SOLN
10.0000 g | Freq: Three times a day (TID) | ORAL | Status: DC
  Administered 2020-08-22 – 2020-08-24 (×6): 10 g via ORAL
  Filled 2020-08-22 (×6): qty 15

## 2020-08-22 MED ORDER — HALOPERIDOL LACTATE 5 MG/ML IJ SOLN
1.0000 mg | Freq: Once | INTRAMUSCULAR | Status: AC
  Administered 2020-08-22: 06:00:00 1 mg via INTRAVENOUS
  Filled 2020-08-22: qty 1

## 2020-08-22 NOTE — Progress Notes (Signed)
Subjective: HD#1 Patient was agitated last night, received 1 mg Haldol and 1 Lactulose enema.  Brad [name] evaluated at bedside this AM. He denies any nausea, SOB, chest pain or abdominal pain. States feels better this morning and ready to go home. Explained about his need to stay in hospital for further management and shows fair understanding.    Objective:  Vital signs in last 24 hours: Vitals:   08/21/20 2315 08/21/20 2345 08/22/20 0000 08/22/20 1137  BP:  100/65  (!) 107/54  Pulse:  62  61  Resp:  18  18  Temp: (!) 97.4 F (36.3 C) 98 F (36.7 C)  98 F (36.7 C)  TempSrc: Axillary Oral  Oral  SpO2:  94%  93%  Weight:  92.4 kg 92.4 kg    CBC Latest Ref Rng & Units 08/22/2020 08/21/2020 07/24/2017  WBC 4.0 - 10.5 K/uL 5.9 3.5(L) 5.1  Hemoglobin 13.0 - 17.0 g/dL 11.6(L) 11.0(L) 12.9(L)  Hematocrit 39.0 - 52.0 % 32.5(L) 33.2(L) 38.7  Platelets 150 - 400 K/uL 78(L) 66(L) 131(L)   BMP Latest Ref Rng & Units 08/22/2020 08/21/2020 07/24/2017  Glucose 70 - 99 mg/dL 68(L) 100(H) 299(H)  BUN 8 - 23 mg/dL 25(H) 23 16  Creatinine 0.61 - 1.24 mg/dL 1.48(H) 1.70(H) 1.10  BUN/Creat Ratio 10 - 24 - - 15  Sodium 135 - 145 mmol/L 138 138 140  Potassium 3.5 - 5.1 mmol/L 4.0 3.8 4.9  Chloride 98 - 111 mmol/L 107 107 103  CO2 22 - 32 mmol/L 22 23 23   Calcium 8.9 - 10.3 mg/dL 8.6(L) 8.5(L) 9.4   General: Well developed, chronically ill male lying in bed, NAD Cardiovascular: RRR, S1, S2 heard, no m/r/g Respiratory: CTAB Abdominal: Soft, distended  Neuro: AAO*3, normal speech, follows commands. Musculoskeletal: trace pedal edema Psych: Normal mood and affect.  Assessment/Plan: 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  Active Problems:   Hepatic encephalopathy  (HCC)  # Hepatic Encephalopathy, Type C, Grade III  # Cirrhosis 2/2 NASH  Patient alert, awake, oriented this morning. Received one lactulose per rectal. Consulted IR for diagnostic and therapeutic paracentesis. They were able to yield 4.8 L of clear yellow fluid. Discontinuing Octreotide and PPI's as does not show any signs is bleeding.   --D/C Lactulose Enemas  --Start oral Lactulose 10 mg TID --D/C Octreotide  --Ciprofloxacin for empiric SBP t/t --D/cIV PPI BID --Blood cultures pending  #AKI  SCr~1.48, BUN 25, baseline 0.80-1.10, GFR 43. Can be pre-renal, hepato-renal?? On spironolactone 100 mg and Lasix 80 daily. Hold given AKI. He has urinary attention, foleycatheter in. Had 1.85 L urine output. Uncertain why patient would be bladder retaining, he is not on any medications that may lead to this. No neurological deficits to suggest spinal involvement.  --F/U BMP --F/U Renal ultrasound  # Thrombocytopenia  Due to his chronic liver disease platelets count in November 2021: PLT 69* 72* 69* 70*. Current platelets 66  -F/u CBC   # Normocytic Anemia in the setting of recent UGIB 2/2 AVMs  Hemoglobin in November 2021: HGB 10.9* 11.3* 10.4* 9.9* . Current Hb 11.0. FOBT negative.  -F/u CBC  # Type 2 Diabetes Mellitus  Blood glucose is 100 on admission  -F/U HbA1c -CBG monitoring -SSI sensitive  #  Gout -Hold Allopurinol until tolerates PO  # Hypertension  Amlodipine 10 mg, Imdur 30 daily listed but not currently taking it. Holding  given normotensive and high risk for hypotension.   Prior to Admission Living Arrangement: Home Anticipated Discharge Location: Home Barriers to Discharge: Ongoing medical management Dispo: Anticipated discharge in approximately 2-3 day(s).   Honor Junes, MD 08/22/2020, 2:47 PM Pager: [phone] After 5pm on weekdays and 1pm on weekends: On Call pager [phone]

## 2020-08-22 NOTE — Procedures (Signed)
PROCEDURE SUMMARY:  Successful US guided paracentesis from RL.  Yielded 4.8 L of clear yellow fluid.  No immediate complications.  Pt tolerated well.   Specimen was sent for labs.  EBL < 72m  KAscencion DikePA-C 08/22/2020 1:56 PM

## 2020-08-23 LAB — COMPREHENSIVE METABOLIC PANEL
ALT: 35 U/L (ref 0–44)
AST: 58 U/L — ABNORMAL HIGH (ref 15–41)
Albumin: 2.2 g/dL — ABNORMAL LOW (ref 3.5–5.0)
Alkaline Phosphatase: 85 U/L (ref 38–126)
Anion gap: 7 (ref 5–15)
BUN: 27 mg/dL — ABNORMAL HIGH (ref 8–23)
CO2: 20 mmol/L — ABNORMAL LOW (ref 22–32)
Calcium: 8.1 mg/dL — ABNORMAL LOW (ref 8.9–10.3)
Chloride: 112 mmol/L — ABNORMAL HIGH (ref 98–111)
Creatinine, Ser: 1.32 mg/dL — ABNORMAL HIGH (ref 0.61–1.24)
GFR, Estimated: 58 mL/min — ABNORMAL LOW (ref >60)
Glucose, Bld: 91 mg/dL (ref 70–99)
Potassium: 4 mmol/L (ref 3.5–5.1)
Sodium: 139 mmol/L (ref 135–145)
Total Bilirubin: 3.5 mg/dL — ABNORMAL HIGH (ref 0.3–1.2)
Total Protein: 5.5 g/dL — ABNORMAL LOW (ref 6.5–8.1)

## 2020-08-23 LAB — CBC
HCT: 30.8 % — ABNORMAL LOW (ref 39.0–52.0)
Hemoglobin: 10.2 g/dL — ABNORMAL LOW (ref 13.0–17.0)
MCH: 31.2 pg (ref 26.0–34.0)
MCHC: 33.1 g/dL (ref 30.0–36.0)
MCV: 94.2 fL (ref 80.0–100.0)
Platelets: 61 10*3/uL — ABNORMAL LOW (ref 150–400)
RBC: 3.27 MIL/uL — ABNORMAL LOW (ref 4.22–5.81)
RDW: 15.4 % (ref 11.5–15.5)
WBC: 4.9 10*3/uL (ref 4.0–10.5)
nRBC: 0 % (ref 0.0–0.2)

## 2020-08-23 LAB — GLUCOSE, CAPILLARY
Glucose-Capillary: 113 mg/dL — ABNORMAL HIGH (ref 70–99)
Glucose-Capillary: 150 mg/dL — ABNORMAL HIGH (ref 70–99)

## 2020-08-23 NOTE — Progress Notes (Signed)
  Mobility Specialist Criteria Algorithm Info.   Mobility Team: [facility] elevated:Self regulated Activity: Ambulated in room; Ambulated to bathroom; Dangled on edge of bed Range of motion: Active; All extremities Level of assistance: Standby assist, set-up cues, supervision of patient - no hands on Assistive device: Other (Comment) (IV Pole) Minutes sitting in chair:  Minutes stood: 2 minutes Minutes ambulated: 2 minutes Distance ambulated (ft): 25 ft Mobility response: Tolerated well; Oriented to self Bed Position: Chair ([place] chair)  Patient constantly yelling out that he had to take a bowel movement, agreed to assist patient to restroom. He got to EOB supine>sit and stood independently. Ambulated w/supervision in room with steady gait to the bathroom using IV Pole to steady. Tolerated ambulation well without any complaints and is now sitting in recliner chair with all needs met.   08/23/2020 10:28 AM

## 2020-08-23 NOTE — Progress Notes (Addendum)
Subjective:   Unable to answer orientation questions although did state he was at [facility]. He was unable to state what city he is in or what year it is. States he wants to go home. Foley removed this morning, but has not yet urinated. He says his mother lives with his brother and he lives in the same area in [place], I called the brother who verifies this information. States that he does seem more confused than normal. He denies chest pain, palpitations, nausea, vomiting, abdominal pain. He expresses understanding of taking his medications.  Objective:  Vital signs in last 24 hours: Vitals:   08/22/20 1137 08/22/20 2011 08/23/20 0159 08/23/20 0538  BP: (!) 107/54 115/68  (!) 111/48  Pulse: 61 70  61  Resp: 18 16  14   Temp: 98 F (36.7 C) 98.7 F (37.1 C)  98.9 F (37.2 C)  TempSrc: Oral Oral  Oral  SpO2: 93% 96%  94%  Weight:   88.8 kg     Physical Exam Constitutional: no acute distress Head: atraumatic ENT: external ears normal Eyes: EOMI Cardiovascular: regular rate and rhythm, normal heart sounds Pulmonary: effort normal, lungs clear to ascultation bilaterally Abdominal: appears mildly distended without clear fluid wave, nontender, no rebound tenderness, bowel sounds normal Musculoskeletal: no asterixis  Skin: warm and dry Neurological: alert, no focal deficit, oriented to person only Psychiatric: normal mood and affect  Assessment/Plan: Brad [name] is a 69 y.o. 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  Principal Problem:   Hepatic encephalopathy (HCC) Active Problems:   Cirrhosis (HCC)  Hepatic Encephalopathy, Type C, Grade III  Cirrhosis 2/2 NASH Patient alert and awake this morning, but oriented only to self. Called family to establish baseline, and brother confirms he is  normally oriented. Received one lactulose per rectal, now on oral. Paracentesis on 12/27 with 4.8L clear fluid. Nucleated cells 124 so no SBP. Albumin <1, so SAAG >2.2 consistent with portal hypertension. -continue oral lactulose 21m TID -continue rifaximin -d/c octreotide and PPI since no evidence of bleeding -d/c ciprofloxacin since no evidence of SPB on paracentesis -holding lasix and spironolactone for AKI -f/u blood cultures -f/u paracentesis cultures  AKI, improving Urinary retention Creatinine 1.32 < 1.48, BUN 27. Baseline 0.80-1.10. Possibly pre-renal or hepatorenal. Holding home lasix and spironolactone given AKI.He has urinary retention, foley catheter in. Uncertain why patient is retaining, he is not on any medications that may lead to this. No neurological deficits to suggest spinal involvement.RUS yesterday without hydronephrosis or hydrolithiasis. -voiding trial today -daily BMP -f/u urine culture  Thrombocytopenia Due to his chronic liver disease. Baseline is around 70, currently 78.  -daily CBC  Normocytic Anemia in the setting of recent UGIB 2/2 AVMs  Hemoglobin in baseline around 10-11. Currently 10.2. FOBT negative. No evidence of bleeding this admission -daily CBC -discontinued octreotide and PPI as above  Type 2 Diabetes Mellitus A1c of 5.6%. Med rec unclear, listed meds include Lantus, Tresiba, metformin, semaglutide, glipizide, canagliflozin and empagliflozin both on meds list.  -CBG monitoring -SSI sensitive  Gout -Hold Allopurinol for renal impairment  # Hypertension  Home meds of Amlodipine 10 mg, Imdur 30 daily but not currently taking it. Holding given normotensive and high risk for hypotension.   Diet:  Heart healthy IVF:  none VTE:  None, thrombocytopenic 78,000 with hx  of AVM and GI bleed Prior to Admission Living Arrangement:  home Anticipated Discharge Location:  home Barriers to Discharge:  Medical management Dispo: Anticipated  discharge in approximately 1-2 day(s).   Brad Au, MD 08/23/2020, 6:03 AM Pager: [phone] After 5pm on weekdays and 1pm on weekends: On Call pager [phone]

## 2020-08-24 DIAGNOSIS — E119 Type 2 diabetes mellitus without complications: Secondary | ICD-10-CM

## 2020-08-24 LAB — CBC
HCT: 31.3 % — ABNORMAL LOW (ref 39.0–52.0)
Hemoglobin: 10.5 g/dL — ABNORMAL LOW (ref 13.0–17.0)
MCH: 32.8 pg (ref 26.0–34.0)
MCHC: 33.5 g/dL (ref 30.0–36.0)
MCV: 97.8 fL (ref 80.0–100.0)
Platelets: 62 10*3/uL — ABNORMAL LOW (ref 150–400)
RBC: 3.2 MIL/uL — ABNORMAL LOW (ref 4.22–5.81)
RDW: 15.6 % — ABNORMAL HIGH (ref 11.5–15.5)
WBC: 3.8 10*3/uL — ABNORMAL LOW (ref 4.0–10.5)
nRBC: 0 % (ref 0.0–0.2)

## 2020-08-24 LAB — COMPREHENSIVE METABOLIC PANEL
ALT: 34 U/L (ref 0–44)
AST: 60 U/L — ABNORMAL HIGH (ref 15–41)
Albumin: 2.2 g/dL — ABNORMAL LOW (ref 3.5–5.0)
Alkaline Phosphatase: 81 U/L (ref 38–126)
Anion gap: 7 (ref 5–15)
BUN: 23 mg/dL (ref 8–23)
CO2: 21 mmol/L — ABNORMAL LOW (ref 22–32)
Calcium: 8.3 mg/dL — ABNORMAL LOW (ref 8.9–10.3)
Chloride: 110 mmol/L (ref 98–111)
Creatinine, Ser: 1.13 mg/dL (ref 0.61–1.24)
GFR, Estimated: >60 mL/min (ref >60)
Glucose, Bld: 165 mg/dL — ABNORMAL HIGH (ref 70–99)
Potassium: 3.7 mmol/L (ref 3.5–5.1)
Sodium: 138 mmol/L (ref 135–145)
Total Bilirubin: 2.3 mg/dL — ABNORMAL HIGH (ref 0.3–1.2)
Total Protein: 5.6 g/dL — ABNORMAL LOW (ref 6.5–8.1)

## 2020-08-24 LAB — PATHOLOGIST SMEAR REVIEW

## 2020-08-24 LAB — GLUCOSE, CAPILLARY: Glucose-Capillary: 225 mg/dL — ABNORMAL HIGH (ref 70–99)

## 2020-08-24 MED ORDER — PRALUENT 75 MG/ML ~~LOC~~ SOAJ: 75.0000 mL | SUBCUTANEOUS | 0 refills | Status: DC

## 2020-08-24 MED ORDER — FUROSEMIDE 40 MG PO TABS
40.0000 mg | ORAL_TABLET | Freq: Every day | ORAL | Status: DC
  Administered 2020-08-24: 09:00:00 40 mg via ORAL
  Filled 2020-08-24: qty 1

## 2020-08-24 MED ORDER — SPIRONOLACTONE 25 MG PO TABS
100.0000 mg | ORAL_TABLET | Freq: Every day | ORAL | Status: DC
  Administered 2020-08-24: 09:00:00 100 mg via ORAL
  Filled 2020-08-24: qty 4

## 2020-08-24 NOTE — TOC Transition Note (Signed)
Transition of Care [facility]) - CM/SW Discharge Note   Patient Details  Name: Brad [name] MRN: [id number] Date of Birth: Oct 19, 1950  [facility]) CM/SW Contact:  Angelita Ingles, RN Phone Number: [phone]  08/24/2020, 11:08 AM   Clinical Narrative:    TOC consulted to set up [facility] RN for medication compliance after d/c. [facility] RN set up with Arville Go. No further needs noted at this time. TOC will sign off.    Final next level of care: [place] Barriers to Discharge: No Barriers Identified   Patient Goals and CMS Choice Patient states their goals for this hospitalization and ongoing recovery are:: Patient states he is ready to go home CMS Medicare.gov Compare Post Acute Care list provided to:: Patient Choice offered to / list presented to : Patient  Discharge Placement                       Discharge Plan and Services                DME Arranged: N/A DME Agency: NA       HH Arranged: RN [place] Agency: [facility] (now [facility]) Date [place]: 08/24/20 Time [place]: 1108 Representative spoke with at [place]: [place]  Social Determinants of Health ([place]) Interventions     Readmission Risk Interventions No flowsheet data found.

## 2020-08-24 NOTE — Discharge Summary (Addendum)
Name: Brad [name] MRN: [id number] DOB: 02-Oct-1950 69 y.o. PCP: Brad Axe, MD  Date of Admission: 08/21/2020 11:11 AM Date of Discharge: 08/24/2020 Attending Physician: Brad [name] Discharge Diagnosis: 1.  Hepatic encephalopathy 2.  Acute kidney injury 3.  Urinary retention  Discharge Medications: Allergies as of 08/24/2020       Reactions   Lisinopril Swelling   Penicillins Swelling   Has patient had a PCN reaction causing immediate rash, facial/tongue/throat swelling, SOB or lightheadedness with hypotension: No Has patient had a PCN reaction causing severe rash involving mucus membranes or skin necrosis: No Has patient had a PCN reaction that required hospitalization: Unknown Has patient had a PCN reaction occurring within the last 10 years: No If all of the above answers are "NO", then may proceed with Cephalosporin use.        Medication List     STOP taking these medications    canagliflozin 100 MG Tabs tablet Commonly known as: INVOKANA   glipiZIDE 5 MG tablet Commonly known as: GLUCOTROL   hydrOXYzine 25 MG tablet Commonly known as: ATARAX/VISTARIL   ibuprofen 200 MG tablet Commonly known as: ADVIL   insulin glargine 100 UNIT/ML injection Commonly known as: LANTUS   isosorbide mononitrate 30 MG 24 hr tablet Commonly known as: IMDUR   Jardiance 10 MG Tabs tablet Generic drug: empagliflozin   liraglutide 18 MG/3ML Sopn Commonly known as: VICTOZA   metFORMIN 500 MG tablet Commonly known as: GLUCOPHAGE   Ozempic (0.25 or 0.5 MG/DOSE) 2 MG/1.5ML Sopn Generic drug: Semaglutide(0.25 or 0.5MG/DOS)   Repatha SureClick 606 MG/ML Soaj Generic drug: Evolocumab   Tresiba FlexTouch 100 UNIT/ML FlexTouch Pen Generic drug: insulin degludec       TAKE these medications    allopurinol 100 MG tablet Commonly known as: ZYLOPRIM Take 100 mg by mouth.   amLODipine 10 MG tablet Commonly known as: NORVASC Take 10 mg by mouth daily.   aspirin  EC 81 MG tablet Take 81 mg by mouth daily.   calcium carbonate 500 MG chewable tablet Commonly known as: TUMS - dosed in mg elemental calcium Chew 1-2 tablets by mouth daily as needed for indigestion or heartburn.   escitalopram 10 MG tablet Commonly known as: LEXAPRO Take 10 mg by mouth daily.   fish oil-omega-3 fatty acids 1000 MG capsule Take 1 g by mouth daily.   fluocinonide cream 0.05 % Commonly known as: LIDEX Apply 1 application topically 2 (two) times daily as needed for itching.   furosemide 40 MG tablet Commonly known as: LASIX Take 40 mg by mouth.   gabapentin 600 MG tablet Commonly known as: NEURONTIN Take 600 mg by mouth 3 (three) times daily.   lactulose 10 GM/15ML solution Commonly known as: CHRONULAC Take 30 g by mouth 3 (three) times daily.   Magnesium 250 MG Tabs Take 250 mg by mouth at bedtime.   magnesium oxide 400 MG tablet Commonly known as: MAG-OX Take 1 tablet by mouth daily.   metoCLOPramide 5 MG tablet Commonly known as: REGLAN Take 5 mg by mouth daily.   multivitamin capsule Take 1 capsule by mouth daily.   nitroGLYCERIN 0.4 MG SL tablet Commonly known as: NITROSTAT Place 0.4 mg under the tongue every 5 (five) minutes x 3 doses as needed for chest pain.   Oxycodone HCl 10 MG Tabs Take 10 mg by mouth 3 (three) times daily as needed for pain.   pantoprazole 40 MG tablet Commonly known as: PROTONIX Take 40 mg by mouth  daily.   potassium chloride SA 20 MEQ tablet Commonly known as: KLOR-CON Take 20 mEq by mouth.   Praluent 75 MG/ML Soaj Generic drug: Alirocumab Inject 75 mLs into the skin every 14 (fourteen) days.   rifaximin 550 MG Tabs tablet Commonly known as: XIFAXAN Take 550 mg by mouth.   solifenacin 5 MG tablet Commonly known as: VESICARE Take 5 mg by mouth at bedtime.   spironolactone 100 MG tablet Commonly known as: ALDACTONE Take 100 mg by mouth.   tiotropium 18 MCG inhalation capsule Commonly known as:  SPIRIVA Place 12 mcg into inhaler and inhale daily.   triamcinolone ointment 0.1 % Commonly known as: KENALOG Apply 1 application topically 2 (two) times daily as needed for itching.   triamcinolone 0.1 % Commonly known as: KENALOG Apply 1 application topically.   zolpidem 6.25 MG CR tablet Commonly known as: AMBIEN CR Take 6.25-12.5 mg by mouth at bedtime as needed for sleep.        Disposition and follow-up:   [name] was discharged from [facility] in Stable condition.  At the hospital follow up visit please address:  1.  Follow-up     A.  Hepatic encephalopathy, Brad [name] cirrhosis-was likely noncompliant with lactulose.  Obtaining home health nurse to help with medications.  Ensure compliance with lactulose regimen.     B.  Acute kidney injury-creatinine 1.13 at time of discharge which is near his baseline.  Ensure resolution     C.  Urinary retention-resolved at time of discharge.  Ensure continued resolution.     D.  Type 2 diabetes mellitus -patient was unclear about home medications, but seemed that he was not taking his Victoza or Lantus.  A1c was 5.6 on admission so these were discontinued.  Restart diabetes medications as needed  2.  Labs / imaging needed at time of follow-up: CMP  3.  Pending labs/ test needing follow-up: Paracentesis culture, blood culture  Follow-up Appointments:  Follow-up Information     Brad Axe, MD.   Specialty: [facility] Medicine Contact information: [address] [place] [address] [place], [place] Follow up.   Why: [name] has been set up with [employer]. The office will call you to set up start of service dates. If you have any questions or concerns please call the number listed above.  Contact information: [address] [place] [address] [phone]                 Hospital Course by problem list: 1.  Hepatic encephalopathy History of  liver cirrhosis secondary to [facility] complicated variceal bleeding.  Presented with altered mental status.  Brother and mother help with caretaking and providing history.  Brother fills his pillbox but does not appear the lactulose.  Upon examination of his remaining medications, he had more lactulose that he should have based on the date of prescription.  Likely this admission was due to under utilization of lactulose.  This was started, along with rifaximin, octreotide, and ciprofloxacin.  FOBT was negative so octreotide was discontinued as there was no other sign of GI bleeding.  Paracentesis removed 4.8 L of clear fluid which had negative Gram stain and 124 nucleated cells, no sign of SBP, culture remained negative. SAAG greater than 2.2 consistent with portal hypertension.  Patient achieved multiple daily bowel movements.  Mental status gradually improved, he was alert and oriented by day of  discharge.  Lasix and spironolactone were initially held due to AKI, but restarted by the day of discharge when AKI resolved.  Counseled patient on lactulose use and he seems understand, seems very unreliable.  Also discussed this with family and ordered home health RN to assist with medications.  2.  AKI Initial creatinine of 1.7 on admission, baseline seems to be 0.8-1.0.  Likely prerenal/hepatorenal.  Improved to 1.13 by day of discharge.  3.  Urinary retention Unclear etiology at this time, required Foley for initial day of hospitalization.  This was discontinued he was able to void spontaneously with low residual bladder scan. Consider repeating a post-void residual at follow up.  4.  Type 2 diabetes mellitus A1c 5.6% on admission, blood sugar was well controlled on minimal (3-5) units of short acting insulin daily.  On further questioning, he denied using any injected medications that he was prescribed Victoza and Lantus 50 units daily.  These are discontinued since he does not seem to require this.  Follow-up  with PCP.  Discharge Vitals:   BP 130/66   Pulse 68   Temp 98.6 F (37 C) (Oral)   Resp 16   Wt 87.2 kg Comment: scale c  SpO2 97%   BMI 26.07 kg/m   Pertinent Labs, Studies, and Procedures:  CT Head Wo Contrast Result Date: 08/21/2020 CLINICAL DATA:  Mental status change. EXAM: CT HEAD WITHOUT CONTRAST TECHNIQUE: Contiguous axial images were obtained from the base of the skull through the vertex without intravenous contrast. COMPARISON:  07/17/2020 from [facility] FINDINGS: Brain: Minimal motion degradation. No mass lesion, hemorrhage, hydrocephalus, acute infarct, intra-axial, or extra-axial fluid collection. Vascular: Intracranial atherosclerosis. Skull: No significant soft tissue swelling. Presumed sebaceous cyst about the right occipital scalp at 1.5 cm. No skull fracture. Sinuses/Orbits: Normal imaged portions of the orbits and globes. Clear paranasal sinuses and mastoid air cells. Other: None. IMPRESSION: 1.  No acute intracranial abnormality. 2. Minimal motion degradation. Electronically Signed   By: Abigail Miyamoto M.D.   On: 08/21/2020 12:40   US RENAL Result Date: 08/22/2020 CLINICAL DATA:  69 year old male with urinary retention. EXAM: RENAL / URINARY TRACT ULTRASOUND COMPLETE COMPARISON:  None. FINDINGS: Right Kidney: Renal measurements: 10.4 x 4.8 x 5.2 cm = volume: 136 mL. Echogenicity within normal limits. No mass or hydronephrosis visualized. Left Kidney: Renal measurements: 10.6 x 4.7 x 3.9 cm = volume: 103 mL. Echogenicity within normal limits. No mass or hydronephrosis visualized. Bladder: The urinary bladder is decompressed around a Foley catheter. Other: The spleen is enlarged measuring 17.7 x 7.5 x 13.0 cm for a volume of 904 cc. There is a small ascites. IMPRESSION: 1. No hydronephrosis or nephrolithiasis. 2. Splenomegaly. 3. Small ascites. Electronically Signed   By: Anner Crete M.D.   On: 08/22/2020 15:38    IR Paracentesis Result Date:  08/22/2020 INDICATION: History of cirrhosis with recurrent ascites. Request for diagnostic and therapeutic paracentesis. EXAM: ULTRASOUND GUIDED RIGHT LOWER QUADRANT PARACENTESIS MEDICATIONS: 1% plain lidocaine, 5 mL COMPLICATIONS: None immediate. PROCEDURE: Informed written consent was obtained from the patient after a discussion of the risks, benefits and alternatives to treatment. A timeout was performed prior to the initiation of the procedure. Initial ultrasound scanning demonstrates a large amount of ascites within the right lower abdominal quadrant. The right lower abdomen was prepped and draped in the usual sterile fashion. 1% lidocaine was used for local anesthesia. Following this, a 19 gauge, 7-cm, Yueh catheter was introduced. An ultrasound image was saved for  documentation purposes. The paracentesis was performed. The catheter was removed and a dressing was applied. The patient tolerated the procedure well without immediate post procedural complication. FINDINGS: A total of approximately 4.8 L of clear yellow fluid was removed. Samples were sent to the laboratory as requested by the clinical team. IMPRESSION: Successful ultrasound-guided paracentesis yielding 4.8 liters of peritoneal fluid. Read by: Ascencion Dike PA-C Electronically Signed   By: Ruthann Cancer MD   On: 08/22/2020 14:05    Recent Labs    08/22/20 0414 08/23/20 0401 08/24/20 0350  CREATININE 1.48* 1.32* 1.13     Discharge Instructions: Discharge Instructions     Call MD for:  extreme fatigue   Complete by: As directed    Call MD for:  persistant dizziness or light-headedness   Complete by: As directed    Diet - low sodium heart healthy   Complete by: As directed    Diet Carb Modified   Complete by: As directed    Discharge instructions   Complete by: As directed    Mr. Hinderman, it has been a pleasure taking care of you. I contacted your PCP for your medication list and have reconciled it with our list, as seen in other  parts of this document. I have discontinued the medications that you are not taking, including the Lantus (insulin) and victoza. I have also asked our social work to arrange a home health nurse to help with your medications. Here are your discharge instructions.  1) make an appointment with your PCP in the next week to follow up from this hospital stay 2) take your lactulose such that you have 2-3 bowel movements per day 3) stop your hydralazine. Your blood pressure has been controlled this admission, your PCP may restart this later   Increase activity slowly   Complete by: As directed    No wound care   Complete by: As directed    No wound care   Complete by: As directed        Signed: Andrew Au, MD 08/24/2020, 2:00 PM   Pager: [phone]

## 2020-08-24 NOTE — Progress Notes (Signed)
D/C instructions given and reviewed. No questions asked but encouraged to call with any questions. Tele and IV removed, tolerated well.

## 2020-08-24 NOTE — Progress Notes (Signed)
   Subjective:   Patient reports feeling well this morning. He does not feel like he is retaining urine. Reports he does not give himself any injections nor does anyone else.  Request discharge home.  Objective:  Vital signs in last 24 hours: Vitals:   08/23/20 0538 08/23/20 1140 08/23/20 1934 08/24/20 0421  BP: (!) 111/48 136/78 125/88 102/63  Pulse: 61 67 67 63  Resp: 14 18 18 16   Temp: 98.9 F (37.2 C) 98.7 F (37.1 C) 98 F (36.7 C) 98.6 F (37 C)  TempSrc: Oral Oral Oral Oral  SpO2: 94% 97% 100% 97%  Weight:    87.2 kg    Physical Exam Constitutional: no acute distress Head: atraumatic ENT: external ears normal Eyes: EOMI Cardiovascular: regular rate and rhythm, normal heart sounds Pulmonary: effort normal, lungs clear to ascultation bilaterally Abdominal: appears mildly distended without clear fluid wave, nontender, no rebound tenderness, bowel sounds normal Musculoskeletal: no asterixis  Skin: warm and dry Neurological: alert, oriented Psychiatric: normal mood and affect  Assessment/Plan: Brad [name] is a 69 y.o. 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  Mental status improved at this time.  Principal Problem:   Hepatic encephalopathy (HCC) Active Problems:   Cirrhosis (HCC)  Hepatic Encephalopathy, Type C, Grade III  Cirrhosis 2/2 NASH Patient alert and awake this morning, but oriented only to self. Called family to establish baseline, and brother confirms he is normally oriented. Received one lactulose per rectal, now on oral. Paracentesis on 12/27 with 4.8L clear fluid. Nucleated cells 124 so no SBP. Albumin <1, so SAAG >2.2 consistent with portal hypertension. -continue oral lactulose 64m TID -continue rifaximin -Restart Lasix spironolactone given resolution of AKI -f/u  blood cultures -f/u paracentesis cultures  AKI, improving Urinary retention Creatinine 1.13  < 1.32. Baseline 0.80-1.10. Possibly pre-renal or hepatorenal. RUS without hydronephrosis or hydrolithiasis.  Urinary retention had resolved, voided twice overnight had a bladder scan volume of 0. -daily BMP -f/u urine culture  Thrombocytopenia Due to his chronic liver disease. Baseline is around 70, currently 78.  -daily CBC  Normocytic Anemia in the setting of recent UGIB 2/2 AVMs  Hemoglobin in baseline around 10-11. Currently 10.2. FOBT negative. No evidence of bleeding this admission -daily CBC -discontinued octreotide and PPI as above  Type 2 Diabetes Mellitus A1c of 5.6%.  Clarify medication list with records from PCP.  His only diabetes medications right now are Lantus and Victoza, but he denies using any injected medications at this time.  His blood sugars well controlled here with very small doses of short-acting insulin only. -CBG monitoring -SSI sensitive -Discharge without his Lantus or Victoza which she has not been taking, follow-up with PCP  Gout -Hold Allopurinol for renal impairment  # Hypertension  Home meds of Amlodipine 10 mg, Imdur 30 daily but not currently taking it. Holding given normotensive and high risk for hypotension.   Diet:  Heart healthy IVF:  none VTE:  None, thrombocytopenic 78,000 with hx of AVM and GI bleed Prior to Admission Living Arrangement:  home Anticipated Discharge Location:  home Barriers to Discharge:  none Dispo: Anticipated discharge in approximately today.   CAndrew Au MD 08/24/2020, 5:42 AM Pager: [phone] 5pm on weekdays and 1pm on weekends: On Call pager [phone]

## 2020-08-24 NOTE — Plan of Care (Signed)

## 2020-08-26 LAB — CULTURE, BLOOD (ROUTINE X 2)
Culture: NO GROWTH
Culture: NO GROWTH
Special Requests: ADEQUATE

## 2020-08-27 LAB — CULTURE, BODY FLUID W GRAM STAIN -BOTTLE: Culture: NO GROWTH

## 2020-09-17 ENCOUNTER — Inpatient Hospital Stay (HOSPITAL_COMMUNITY)
Admission: EM | Admit: 2020-09-17 | Discharge: 2020-09-21 | DRG: 441 | Discharge status: Against Medical Advice | Payer: Medicare HMO | Attending: Internal Medicine | Admitting: Internal Medicine

## 2020-09-17 ENCOUNTER — Other Ambulatory Visit: Payer: Self-pay

## 2020-09-17 ENCOUNTER — Encounter (HOSPITAL_COMMUNITY): Payer: Self-pay | Admitting: Emergency Medicine

## 2020-09-17 DIAGNOSIS — Z888 Allergy status to other drugs, medicaments and biological substances status: Secondary | ICD-10-CM

## 2020-09-17 DIAGNOSIS — N320 Bladder-neck obstruction: Secondary | ICD-10-CM

## 2020-09-17 DIAGNOSIS — Z20822 Contact with and (suspected) exposure to covid-19: Secondary | ICD-10-CM | POA: Diagnosis present

## 2020-09-17 DIAGNOSIS — Z880 Allergy status to penicillin: Secondary | ICD-10-CM

## 2020-09-17 DIAGNOSIS — R188 Other ascites: Secondary | ICD-10-CM | POA: Diagnosis present

## 2020-09-17 DIAGNOSIS — N189 Chronic kidney disease, unspecified: Secondary | ICD-10-CM | POA: Diagnosis present

## 2020-09-17 DIAGNOSIS — Z800 Family history of malignant neoplasm of digestive organs: Secondary | ICD-10-CM

## 2020-09-17 DIAGNOSIS — I8511 Secondary esophageal varices with bleeding: Secondary | ICD-10-CM | POA: Diagnosis present

## 2020-09-17 DIAGNOSIS — R339 Retention of urine, unspecified: Secondary | ICD-10-CM | POA: Diagnosis present

## 2020-09-17 DIAGNOSIS — I129 Hypertensive chronic kidney disease with stage 1 through stage 4 chronic kidney disease, or unspecified chronic kidney disease: Secondary | ICD-10-CM | POA: Diagnosis present

## 2020-09-17 DIAGNOSIS — Z87891 Personal history of nicotine dependence: Secondary | ICD-10-CM

## 2020-09-17 DIAGNOSIS — K7290 Hepatic failure, unspecified without coma: Secondary | ICD-10-CM | POA: Diagnosis not present

## 2020-09-17 DIAGNOSIS — G9340 Encephalopathy, unspecified: Secondary | ICD-10-CM

## 2020-09-17 DIAGNOSIS — R1110 Vomiting, unspecified: Secondary | ICD-10-CM | POA: Diagnosis not present

## 2020-09-17 DIAGNOSIS — Z7982 Long term (current) use of aspirin: Secondary | ICD-10-CM

## 2020-09-17 DIAGNOSIS — N179 Acute kidney failure, unspecified: Secondary | ICD-10-CM | POA: Diagnosis present

## 2020-09-17 DIAGNOSIS — K219 Gastro-esophageal reflux disease without esophagitis: Secondary | ICD-10-CM | POA: Diagnosis present

## 2020-09-17 DIAGNOSIS — Z5329 Procedure and treatment not carried out because of patient's decision for other reasons: Secondary | ICD-10-CM | POA: Diagnosis present

## 2020-09-17 DIAGNOSIS — K7682 Hepatic encephalopathy: Secondary | ICD-10-CM | POA: Diagnosis present

## 2020-09-17 DIAGNOSIS — D638 Anemia in other chronic diseases classified elsewhere: Secondary | ICD-10-CM | POA: Diagnosis present

## 2020-09-17 DIAGNOSIS — N1831 Chronic kidney disease, stage 3a: Secondary | ICD-10-CM | POA: Diagnosis present

## 2020-09-17 DIAGNOSIS — K922 Gastrointestinal hemorrhage, unspecified: Secondary | ICD-10-CM | POA: Diagnosis present

## 2020-09-17 DIAGNOSIS — E785 Hyperlipidemia, unspecified: Secondary | ICD-10-CM | POA: Diagnosis present

## 2020-09-17 DIAGNOSIS — E876 Hypokalemia: Secondary | ICD-10-CM | POA: Diagnosis present

## 2020-09-17 DIAGNOSIS — K7581 Nonalcoholic steatohepatitis (NASH): Secondary | ICD-10-CM | POA: Diagnosis present

## 2020-09-17 DIAGNOSIS — K7460 Unspecified cirrhosis of liver: Secondary | ICD-10-CM | POA: Diagnosis present

## 2020-09-17 DIAGNOSIS — Z9049 Acquired absence of other specified parts of digestive tract: Secondary | ICD-10-CM

## 2020-09-17 DIAGNOSIS — E1122 Type 2 diabetes mellitus with diabetic chronic kidney disease: Secondary | ICD-10-CM | POA: Diagnosis present

## 2020-09-17 DIAGNOSIS — I459 Conduction disorder, unspecified: Secondary | ICD-10-CM | POA: Diagnosis present

## 2020-09-17 DIAGNOSIS — M109 Gout, unspecified: Secondary | ICD-10-CM | POA: Diagnosis present

## 2020-09-17 DIAGNOSIS — Z79899 Other long term (current) drug therapy: Secondary | ICD-10-CM

## 2020-09-17 NOTE — ED Notes (Signed)
Could not get temp

## 2020-09-17 NOTE — ED Triage Notes (Signed)
Patient arrived with EMS from home reports multiple emesis with generalized weakness/fatigue and poor appetite this week .

## 2020-09-18 ENCOUNTER — Inpatient Hospital Stay (HOSPITAL_COMMUNITY): Payer: Medicare HMO

## 2020-09-18 DIAGNOSIS — I10 Essential (primary) hypertension: Secondary | ICD-10-CM | POA: Diagnosis not present

## 2020-09-18 DIAGNOSIS — I129 Hypertensive chronic kidney disease with stage 1 through stage 4 chronic kidney disease, or unspecified chronic kidney disease: Secondary | ICD-10-CM | POA: Diagnosis present

## 2020-09-18 DIAGNOSIS — N1831 Chronic kidney disease, stage 3a: Secondary | ICD-10-CM | POA: Diagnosis present

## 2020-09-18 DIAGNOSIS — G9340 Encephalopathy, unspecified: Secondary | ICD-10-CM | POA: Diagnosis not present

## 2020-09-18 DIAGNOSIS — K922 Gastrointestinal hemorrhage, unspecified: Secondary | ICD-10-CM | POA: Diagnosis present

## 2020-09-18 DIAGNOSIS — Z9049 Acquired absence of other specified parts of digestive tract: Secondary | ICD-10-CM | POA: Diagnosis not present

## 2020-09-18 DIAGNOSIS — Z880 Allergy status to penicillin: Secondary | ICD-10-CM | POA: Diagnosis not present

## 2020-09-18 DIAGNOSIS — R188 Other ascites: Secondary | ICD-10-CM | POA: Diagnosis present

## 2020-09-18 DIAGNOSIS — E785 Hyperlipidemia, unspecified: Secondary | ICD-10-CM | POA: Diagnosis present

## 2020-09-18 DIAGNOSIS — K7290 Hepatic failure, unspecified without coma: Secondary | ICD-10-CM | POA: Diagnosis present

## 2020-09-18 DIAGNOSIS — K7460 Unspecified cirrhosis of liver: Secondary | ICD-10-CM | POA: Diagnosis present

## 2020-09-18 DIAGNOSIS — D649 Anemia, unspecified: Secondary | ICD-10-CM | POA: Diagnosis not present

## 2020-09-18 DIAGNOSIS — Z7982 Long term (current) use of aspirin: Secondary | ICD-10-CM | POA: Diagnosis not present

## 2020-09-18 DIAGNOSIS — M109 Gout, unspecified: Secondary | ICD-10-CM | POA: Diagnosis present

## 2020-09-18 DIAGNOSIS — Z87891 Personal history of nicotine dependence: Secondary | ICD-10-CM | POA: Diagnosis not present

## 2020-09-18 DIAGNOSIS — R339 Retention of urine, unspecified: Secondary | ICD-10-CM | POA: Diagnosis present

## 2020-09-18 DIAGNOSIS — Z800 Family history of malignant neoplasm of digestive organs: Secondary | ICD-10-CM | POA: Diagnosis not present

## 2020-09-18 DIAGNOSIS — E1122 Type 2 diabetes mellitus with diabetic chronic kidney disease: Secondary | ICD-10-CM | POA: Diagnosis present

## 2020-09-18 DIAGNOSIS — R1110 Vomiting, unspecified: Secondary | ICD-10-CM | POA: Diagnosis present

## 2020-09-18 DIAGNOSIS — D638 Anemia in other chronic diseases classified elsewhere: Secondary | ICD-10-CM | POA: Diagnosis present

## 2020-09-18 DIAGNOSIS — K219 Gastro-esophageal reflux disease without esophagitis: Secondary | ICD-10-CM | POA: Diagnosis present

## 2020-09-18 DIAGNOSIS — K7581 Nonalcoholic steatohepatitis (NASH): Secondary | ICD-10-CM | POA: Diagnosis present

## 2020-09-18 DIAGNOSIS — Z79899 Other long term (current) drug therapy: Secondary | ICD-10-CM | POA: Diagnosis not present

## 2020-09-18 DIAGNOSIS — N179 Acute kidney failure, unspecified: Secondary | ICD-10-CM | POA: Diagnosis present

## 2020-09-18 DIAGNOSIS — Z20822 Contact with and (suspected) exposure to covid-19: Secondary | ICD-10-CM | POA: Diagnosis present

## 2020-09-18 DIAGNOSIS — E876 Hypokalemia: Secondary | ICD-10-CM | POA: Diagnosis present

## 2020-09-18 DIAGNOSIS — I8511 Secondary esophageal varices with bleeding: Secondary | ICD-10-CM | POA: Diagnosis present

## 2020-09-18 DIAGNOSIS — Z888 Allergy status to other drugs, medicaments and biological substances status: Secondary | ICD-10-CM | POA: Diagnosis not present

## 2020-09-18 LAB — CBC
HCT: 31.4 % — ABNORMAL LOW (ref 39.0–52.0)
Hemoglobin: 10.5 g/dL — ABNORMAL LOW (ref 13.0–17.0)
MCH: 31.3 pg (ref 26.0–34.0)
MCHC: 33.4 g/dL (ref 30.0–36.0)
MCV: 93.7 fL (ref 80.0–100.0)
Platelets: 59 10*3/uL — ABNORMAL LOW (ref 150–400)
RBC: 3.35 MIL/uL — ABNORMAL LOW (ref 4.22–5.81)
RDW: 17.1 % — ABNORMAL HIGH (ref 11.5–15.5)
WBC: 4.9 10*3/uL (ref 4.0–10.5)
nRBC: 0 % (ref 0.0–0.2)

## 2020-09-18 LAB — COMPREHENSIVE METABOLIC PANEL
ALT: 40 U/L (ref 0–44)
AST: 58 U/L — ABNORMAL HIGH (ref 15–41)
Albumin: 3.1 g/dL — ABNORMAL LOW (ref 3.5–5.0)
Alkaline Phosphatase: 92 U/L (ref 38–126)
Anion gap: 11 (ref 5–15)
BUN: 44 mg/dL — ABNORMAL HIGH (ref 8–23)
CO2: 23 mmol/L (ref 22–32)
Calcium: 8.8 mg/dL — ABNORMAL LOW (ref 8.9–10.3)
Chloride: 103 mmol/L (ref 98–111)
Creatinine, Ser: 2.87 mg/dL — ABNORMAL HIGH (ref 0.61–1.24)
GFR, Estimated: 23 mL/min — ABNORMAL LOW (ref >60)
Glucose, Bld: 148 mg/dL — ABNORMAL HIGH (ref 70–99)
Potassium: 3.6 mmol/L (ref 3.5–5.1)
Sodium: 137 mmol/L (ref 135–145)
Total Bilirubin: 2.2 mg/dL — ABNORMAL HIGH (ref 0.3–1.2)
Total Protein: 6.6 g/dL (ref 6.5–8.1)

## 2020-09-18 LAB — LACTIC ACID, PLASMA
Lactic Acid, Venous: 2 mmol/L (ref 0.5–1.9)
Lactic Acid, Venous: 1.6 mmol/L (ref 0.5–1.9)

## 2020-09-18 LAB — URINALYSIS, ROUTINE W REFLEX MICROSCOPIC
Bilirubin Urine: NEGATIVE
Glucose, UA: NEGATIVE mg/dL
Hgb urine dipstick: NEGATIVE
Ketones, ur: NEGATIVE mg/dL
Leukocytes,Ua: NEGATIVE
Nitrite: NEGATIVE
Protein, ur: NEGATIVE mg/dL
Specific Gravity, Urine: 1.015 (ref 1.005–1.030)
pH: 5 (ref 5.0–8.0)

## 2020-09-18 LAB — RAPID URINE DRUG SCREEN, HOSP PERFORMED
Amphetamines: NOT DETECTED
Barbiturates: NOT DETECTED
Benzodiazepines: NOT DETECTED
Cocaine: NOT DETECTED
Opiates: NOT DETECTED
Tetrahydrocannabinol: NOT DETECTED

## 2020-09-18 LAB — SARS CORONAVIRUS 2 BY RT PCR (HOSPITAL ORDER, PERFORMED IN ~~LOC~~ HOSPITAL LAB): SARS Coronavirus 2: NEGATIVE

## 2020-09-18 LAB — MAGNESIUM: Magnesium: 3 mg/dL — ABNORMAL HIGH (ref 1.7–2.4)

## 2020-09-18 LAB — PROTIME-INR
INR: 1.4 — ABNORMAL HIGH (ref 0.8–1.2)
Prothrombin Time: 16.7 seconds — ABNORMAL HIGH (ref 11.4–15.2)

## 2020-09-18 LAB — AMMONIA: Ammonia: 80 umol/L — ABNORMAL HIGH (ref 9–35)

## 2020-09-18 LAB — ETHANOL: Alcohol, Ethyl (B): <10 mg/dL (ref <10)

## 2020-09-18 LAB — LIPASE, BLOOD: Lipase: 38 U/L (ref 11–51)

## 2020-09-18 MED ORDER — ALBUMIN HUMAN 25 % IV SOLN
25.0000 g | Freq: Once | INTRAVENOUS | Status: DC | PRN
  Filled 2020-09-18: qty 100

## 2020-09-18 MED ORDER — SODIUM CHLORIDE 0.9 % IV BOLUS
500.0000 mL | Freq: Once | INTRAVENOUS | Status: AC
  Administered 2020-09-18: 500 mL via INTRAVENOUS

## 2020-09-18 MED ORDER — RIFAXIMIN 550 MG PO TABS
550.0000 mg | ORAL_TABLET | Freq: Two times a day (BID) | ORAL | Status: DC
  Administered 2020-09-18 – 2020-09-21 (×8): 550 mg via ORAL
  Filled 2020-09-18 (×10): qty 1

## 2020-09-18 MED ORDER — SODIUM CHLORIDE 0.9 % IV SOLN
2.0000 g | INTRAVENOUS | Status: DC
  Administered 2020-09-18: 2 g via INTRAVENOUS
  Filled 2020-09-18 (×2): qty 20

## 2020-09-18 MED ORDER — LACTULOSE 10 GM/15ML PO SOLN
30.0000 g | Freq: Four times a day (QID) | ORAL | Status: DC
  Administered 2020-09-18 (×3): 30 g via ORAL
  Filled 2020-09-18 (×5): qty 45

## 2020-09-18 MED ORDER — SODIUM CHLORIDE 0.9% FLUSH
3.0000 mL | Freq: Two times a day (BID) | INTRAVENOUS | Status: DC
  Administered 2020-09-18 – 2020-09-20 (×5): 3 mL via INTRAVENOUS

## 2020-09-18 MED ORDER — LACTATED RINGERS IV SOLN: INTRAVENOUS | Status: AC

## 2020-09-18 NOTE — Progress Notes (Signed)
Pharmacy Antibiotic Note  Brad [name] is a 70 y.o. male admitted on 09/17/2020 with concern for SBP.  Pharmacy has been consulted for cirpo dosing.  Noted pcn allergy, low risk allergy and low cross-sensitivity with cephalosporin, has tolerated 1st generation cephalosporin in past, will switch to ceftriaxone.    Plan: Ceftriaxone 2g IV every 24 hours Monitor clinical progression and LOT     Temp (24hrs), Avg:98.1 F (36.7 C), Min:97.6 F (36.4 C), Max:98.5 F (36.9 C)  Recent Labs  Lab 09/18/20 0647 09/18/20 0705 09/18/20 1343  WBC  --  4.9  --   CREATININE  --  2.87*  --   LATICACIDVEN 2.0*  --  1.6    CrCl cannot be calculated (Unknown ideal weight.).    Allergies  Allergen Reactions  . Lisinopril Swelling  . Penicillins Swelling    Has patient had a PCN reaction causing immediate rash, facial/tongue/throat swelling, SOB or lightheadedness with hypotension: No Has patient had a PCN reaction causing severe rash involving mucus membranes or skin necrosis: No Has patient had a PCN reaction that required hospitalization: Unknown Has patient had a PCN reaction occurring within the last 10 years: No If all of the above answers are "NO", then may proceed with Cephalosporin use.     Brad [name], PharmD Clinical Pharmacist ED Pharmacist Phone # [phone] 09/18/2020 4:51 PM

## 2020-09-18 NOTE — ED Notes (Addendum)
Patient has not been brought back to assigned bed at this time.

## 2020-09-18 NOTE — ED Provider Notes (Addendum)
[facility] EMERGENCY DEPARTMENT Provider Note   CSN: [id number] Arrival date & time: 09/17/20  2134     History Chief Complaint  Patient presents with  . Emesis    Brad [name] is a 70 y.o. male.  Patient brought to the emergency department by ambulance from home.  Patient or family reportedly called EMS to be evaluated for nausea and vomiting, although it is difficult to corroborate this.  At arrival to the emergency department, patient refuses to answer any questions or interact in any way.  Level 5 caveat due to refusal to answer questions.        Past Medical History:  Diagnosis Date  . Cirrhosis of liver not due to alcohol ([name])   . DDD (degenerative disc disease), lumbar   . Diabetes mellitus without complication ([name])   . Gout   . Hypertension     Patient Active Problem List   Diagnosis Date Noted  . Diabetes ([name]) 08/24/2020  . Hepatic encephalopathy ([name]) 08/21/2020  . Abnormal stress test 07/24/2017  . Anginal chest pain at rest [facility]) 07/24/2017  . Benign essential hypertension 11/09/2015  . Cirrhosis ([name]) 11/09/2015  . Hyperlipidemia 11/09/2015  . Insulin long-term use ([place]) 11/09/2015  . OSA (obstructive sleep apnea) 11/09/2015  . Uncontrolled type 2 diabetes mellitus without complication, with long-term current use of insulin 11/09/2015  . 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 Neck pain on left side 04/28/2013    Past Surgical History:  Procedure Laterality Date  . ADENOIDECTOMY    . ANKLE SURGERY    . CHOLECYSTECTOMY    . IR PARACENTESIS  08/22/2020  . IR RADIOLOGIST EVAL & MGMT  02/10/2019  . LEFT HEART CATH AND CORONARY ANGIOGRAPHY N/A 07/29/2017   Procedure: LEFT HEART CATH AND CORONARY ANGIOGRAPHY;  Surgeon: Lorretta Harp, MD;  Location: [place] [facility];  Service: Cardiovascular;  Laterality: N/A;  . TONSILLECTOMY         No family history on file.  Social History   Tobacco Use  . Smoking status: Former Research scientist (life sciences)  .  Smokeless tobacco: Never Used  Substance Use Topics  . Alcohol use: No  . Drug use: No    Home Medications Prior to Admission medications   Medication Sig Start Date End Date Taking? Authorizing Provider  Alirocumab (PRALUENT) 75 MG/ML SOAJ Inject 75 mLs into the skin every 14 (fourteen) days. 08/24/20   Andrew Au, MD  allopurinol (ZYLOPRIM) 100 MG tablet Take 100 mg by mouth.    [provider]  amLODipine (NORVASC) 10 MG tablet Take 10 mg by mouth daily.    [provider]  aspirin EC 81 MG tablet Take 81 mg by mouth daily.    [provider]  calcium carbonate (TUMS - DOSED IN MG ELEMENTAL CALCIUM) 500 MG chewable tablet Chew 1-2 tablets by mouth daily as needed for indigestion or heartburn.    [provider]  escitalopram (LEXAPRO) 10 MG tablet Take 10 mg by mouth daily. 07/28/20   [provider]  fish oil-omega-3 fatty acids 1000 MG capsule Take 1 g by mouth daily.     [provider]  fluocinonide cream (LIDEX) 3.15 % Apply 1 application topically 2 (two) times daily as needed for itching. 06/20/17   [provider]  furosemide (LASIX) 40 MG tablet Take 40 mg by mouth. 08/10/20   [provider]  gabapentin (NEURONTIN) 600 MG tablet Take 600 mg by mouth 3 (three) times daily. 06/26/17  [provider]  lactulose (CHRONULAC) 10 GM/15ML solution Take 30 g by mouth 3 (three) times daily. 03/18/20   [provider]  Magnesium 250 MG TABS Take 250 mg by mouth at bedtime.     [provider]  magnesium oxide (MAG-OX) 400 MG tablet Take 1 tablet by mouth daily. 05/26/20   [provider]  metoCLOPramide (REGLAN) 5 MG tablet Take 5 mg by mouth daily.     [provider]  Multiple Vitamin (MULTIVITAMIN) capsule Take 1 capsule by mouth daily.    [provider]  nitroGLYCERIN (NITROSTAT) 0.4 MG SL tablet Place 0.4 mg under the tongue every 5 (five) minutes x 3 doses  as needed for chest pain.  07/12/17   [provider]  Oxycodone HCl 10 MG TABS Take 10 mg by mouth 3 (three) times daily as needed for pain. 06/26/17   [provider]  pantoprazole (PROTONIX) 40 MG tablet Take 40 mg by mouth daily.    [provider]  potassium chloride SA (KLOR-CON) 20 MEQ tablet Take 20 mEq by mouth.    [provider]  rifaximin (XIFAXAN) 550 MG TABS tablet Take 550 mg by mouth.    [provider]  solifenacin (VESICARE) 5 MG tablet Take 5 mg by mouth at bedtime.     [provider]  spironolactone (ALDACTONE) 100 MG tablet Take 100 mg by mouth. 07/22/17   [provider]  tiotropium (SPIRIVA) 18 MCG inhalation capsule Place 12 mcg into inhaler and inhale daily. 05/31/15   [provider]  triamcinolone (KENALOG) 0.1 % Apply 1 application topically. 05/19/20   [provider]  triamcinolone ointment (KENALOG) 0.1 % Apply 1 application topically 2 (two) times daily as needed for itching. 06/13/17   [provider]  zolpidem (AMBIEN CR) 6.25 MG CR tablet Take 6.25-12.5 mg by mouth at bedtime as needed for sleep. 06/26/17   [provider]    Allergies    Lisinopril and Penicillins  Review of Systems   Review of Systems  Unable to perform ROS: Psychiatric disorder    Physical Exam Updated Vital Signs BP (!) 108/96 (BP Location: Right Arm)   Pulse 64   Resp 16   SpO2 98%   Physical Exam Vitals and nursing note reviewed.  Constitutional:      General: He is not in acute distress.    Appearance: Normal appearance. He is well-developed and well-nourished.     Comments: Sitting in the chair, surgical mask covering his eyes, rocking back and forth stating "God damn it"  HENT:     Head: Normocephalic and atraumatic.     Right Ear: Hearing normal.     Left Ear: Hearing normal.     Nose: Nose normal.     Mouth/Throat:     Mouth: Oropharynx is clear and moist and mucous  membranes are normal.  Eyes:     Extraocular Movements: EOM normal.     Conjunctiva/sclera: Conjunctivae normal.     Pupils: Pupils are equal, round, and reactive to light.  Cardiovascular:     Rate and Rhythm: Regular rhythm.     Heart sounds: S1 normal and S2 normal. No murmur heard. No friction rub. No gallop.   Pulmonary:     Effort: Pulmonary effort is normal. No respiratory distress.     Breath sounds: Normal breath sounds.  Chest:     Chest wall: No tenderness.  Abdominal:     General: Bowel sounds are  normal.     Palpations: Abdomen is soft. There is no hepatosplenomegaly.     Tenderness: There is no abdominal tenderness. There is no guarding or rebound. Negative signs include Murphy's sign and McBurney's sign.     Hernia: No hernia is present.  Musculoskeletal:        General: Normal range of motion.     Cervical back: Normal range of motion and neck supple.  Skin:    General: Skin is warm, dry and intact.     Findings: No rash.     Nails: There is no cyanosis.  Neurological:     Mental Status: He is alert and oriented to person, place, and time.     GCS: GCS eye subscore is 4. GCS verbal subscore is 5. GCS motor subscore is 6.     Cranial Nerves: No cranial nerve deficit.     Sensory: No sensory deficit.     Coordination: Coordination normal.     Deep Tendon Reflexes: Strength normal.  Psychiatric:        Mood and Affect: Mood and affect normal.        Speech: Speech normal.        Behavior: Behavior normal.        Thought Content: Thought content normal.     ED Results / Procedures / Treatments   Labs (all labs ordered are listed, but only abnormal results are displayed) Labs Reviewed  LIPASE, BLOOD  COMPREHENSIVE METABOLIC PANEL  CBC  URINALYSIS, ROUTINE W REFLEX MICROSCOPIC  AMMONIA  ETHANOL  RAPID URINE DRUG SCREEN, HOSP PERFORMED  LACTIC ACID, PLASMA    EKG None  Radiology No results found.  Procedures Procedures (including critical care  time)  Medications Ordered in ED Medications - No data to display  ED Course  I have reviewed the triage vital signs and the nursing notes.  Pertinent labs & imaging results that were available during my care of the patient were reviewed by me and considered in my medical decision making (see chart for details).    MDM Rules/Calculators/A&P                          Patient appears to be acutely confused and is not interacting with staff appropriately.  He indicates that he does not want any testing done, when staff approaches him to draw blood or do any test he screams "no".  Wife was contacted at home and she reports that he has a history of behaving this way when his ammonia is elevated as he does have a history of cirrhosis.  Patient appears to be incapacitated and is not in any condition to refuse care at this time.  We will therefore go ahead with blood draw, provide sedation or restraints as necessary.  Labs are currently pending.  Signout oncoming ER physician to follow-up and disposition patient.  Final Clinical Impression(s) / ED Diagnoses Final diagnoses:  Encephalopathy acute    Rx / DC Orders ED Discharge Orders    None       Dyanna Seiter, Gwenyth Allegra, MD 09/18/20 8756    Orpah Greek, MD 09/18/20 [phone]

## 2020-09-18 NOTE — ED Notes (Signed)
Spoke with main lab regarding not all lab work stating in process and that some were ordered last night, they report they will address same.

## 2020-09-18 NOTE — ED Provider Notes (Signed)
70 yo male ho cirrhosis presents with vomiting, altered mental status Physical Exam  BP (!) 105/56   Pulse 62   Temp 97.6 F (36.4 C) (Axillary)   Resp 18   SpO2 97%   Physical Exam  ED Course/Procedures     Procedures  MDM  AMS suspected secondary to hepatic encephalopathy. No ho substance abuse No trauma, nonfocal exam, no fever Probable admission  8:36 AM Labs returning and ammonia, lactic acid and creatinine elevated Requested patient roomed for better exam and treatment. Viewed chart. Noted patient recently admitted to internal medicine teaching service with diagnosis of hepatic encephalopathy, AKI, urinary retention. Patient had recent ultrasound-guided paracentesis at [facility] on January 10.   Patient moved to exam room Moans and complains of cold but difficult to get any clear history or complaints. Generall unkempt somewhat ill appearing patient Heent- no signs of trauma Mouth mucous membranes are very dry lungst- cta Heart rrr Abdomen distended, erythematous lesions with mild abrasions consistent with scratching No ttp on exam Extremities thing with bilateral wrist splints in place- no underlying lesions or abnormalities noted Skin with erythema left arm with serpentine border, no warm or indurated, no swelling noted   ED ECG REPORT   Date: 09/18/2020  Rate: 62  Rhythm: normal sinus rhythm  QRS Axis: normal  Intervals: normal  ST/T Wave abnormalities: normal  Conduction Disutrbances:nonspecific intraventricular conduction delay  Narrative Interpretation:   Old EKG Reviewed: unchanged  I have personally reviewed the EKG tracing and agree with the computerized printout as noted.   1- hepatic encephalopathy- hydration ensuing, ho nash 2- aki- iv fluids being started 3- anemia- stable Plan admission for ongoing evaluation and treatment of above Discussed with Dr. Sharon Seller who will see for admission   Pattricia Boss, MD 09/18/20 1100

## 2020-09-18 NOTE — ED Notes (Signed)
Pt is more alert (person, place and time) and oriented. Pt said we can call his brother and give him any information he needs. I reminded the patient that he initially did not want me to talk to his brother earlier. He said he was mad at him because the brother always brings him to the hospital. Pt also said his brother is his POA although he admitted he has not done the paperwork because he has been sick.

## 2020-09-18 NOTE — ED Notes (Signed)
Spoke with patient's spouse, Jenny Reichmann, they no longer stay together but she was able to offer information such as patient behavior when ammonia was high in the past. She reports this seems similar to previous episodes. She spoke with patient on the phone to attempt to get him to cooperate with care, patient continued to yell "no". Dr. Betsey Holiday made aware of same.

## 2020-09-18 NOTE — ED Notes (Addendum)
Unable to obtain labs, patient will not follow commands/answer staff questions, keeps arms crossed while staff at bedside. When staff walks away, patient uncrosses arms. Per note in ED trackboard, patient also refused for Brad [name] to draw labs at 2340 in waiting room.

## 2020-09-18 NOTE — ED Notes (Signed)
Pt wanted to talk to his brother. I dialed the phone for him.

## 2020-09-18 NOTE — H&P (Signed)
Date: 09/18/2020               Patient Name:  Brad [name] MRN: [id number]  DOB: 1950/09/20 Age / Sex: 70 y.o., male   PCP: Brad Axe, MD         Medical Service: Internal Medicine Teaching Service         Attending Physician: Dr. Jimmye [name], Brad Pattee, MD    First Contact: Dr. Bridgett [name] Pager: [phone]  Second Contact: Dr. Sharon [name] Pager: [phone]       After Hours (After 5p/  First Contact Pager: [phone]  weekends / holidays): Second Contact Pager: [phone]   Chief Complaint: AMS  History of Present Illness:   Brad [name] is a 70yo male with PMH of cirrhosis 2/2 NASH, variceal bleed s/p banding, HTN, HLD, GERD, GI bleed with AVMs cauterized 06/2020, bladder outlet obstruction who was admitted from 12/26-12/29 with hepatic encephalopathy and GI bleed, now presenting from with acute encephalopathy.  The patient was acutely altered when seen in the room. He states "I don't know" when asked most questions but denies abdominal pain, shortness of breath. He is not sure if he has had difficulty with urinating recently. He states he is hungry and would like to eat. He states he has been taking his lactulose but is unsure of other medications.   Discussed with Regan's brother Brad [name], who he has been staying with. Brad [name] states since discharge his brother was doing well, but then started to feel sick two days ago. Brad [name] did not complain of abdominal pain, nausea, cough, SOB, or difficulty with urinating throughout the last few weeks. Yesterday morning he noticed Brad [name] seemed more confused and yesterday evening he went to check on him and Donique had vomited in the bed. His vomit was orange, he did not notice any blood or black coffee ground type emesis.  He states his brother just went for a paracentesis the other day, and was told his ammonia was elevated. He was supposed to take additional medication for this he thinks but is not sure which medications.  Brad [name] helps his mother with his  medications and they have had a nurse coming to the house once a week to help, but they were confused by some of the medication changes at discharge. He did read the medication bottles that [name] was taking as far as he knows and these include Lasix, spironolactone (twice per day), lactulose and rifaximin. He has continued to eat during this time without difficulty and seems to be swallowing food ok.  He notes that even though he just had a paracentesis on 1/20 his stomach looks very distended.   ED Course:  Ammonia 80  Social:   He lives with his brother Brad [name] in [place] and is separated from his wife He smokes but brother is not sure how much   Family History:   No family history on file.  Meds:  No outpatient medications have been marked as taking for the 09/17/20 encounter [facility] Encounter).   Rifaximin Amlodipine 10 mg  Escitalopram 10 mg  Allopurinol 100 mg qd  Gabapentin 600 mg tid Spironolactone 100 mg bid  Zolpidem tartrate Pantoprazole 40 mg qd  magnesium 250 mg qhs Furosemide 40 mg  solifenacin 5 mg qh  He doesn't have metoclopramide  Allergies: Allergies as of 09/17/2020 - Review Complete 08/22/2020  Allergen Reaction Noted  . Lisinopril Swelling 06/07/2015  . Penicillins Swelling 12/28/2012   Past Medical History:  Diagnosis Date  .  Cirrhosis of liver not due to alcohol ([place])   . DDD (degenerative disc disease), lumbar   . Diabetes mellitus without complication ([place])   . Gout   . Hypertension      Review of Systems: A complete ROS was negative except as per HPI.   Physical Exam: Blood pressure 101/87, pulse 63, temperature 98.5 F (36.9 C), temperature source Oral, resp. rate (!) 24, SpO2 97 %.  Constitution: No distress but restless, acutely ill appearing  HENT: [place]/AT Eyes: no injection, mild icterus Cardio: RRR, no m/r/g, no JVD or LE edema  Respiratory: clear to auscultation bilaterally  Abdominal: very distended, soft, NTTP, +BS   MSK: moving all extremities, distinct asterixis  Neuro: oriented to self and place but not year, understands that he is confused  Skin: scattered ecchymosis and purpura over abdomen, chest, legs and arms   EKG: personally reviewed my interpretation is NSR with low voltage, qTC normal  Assessment & Plan by Problem: Active Problems:   Hepatic encephalopathy (HCC)  Brad [name] is a 70yo male with PMH of cirrhosis 2/2 NASH, variceal bleed s/p banding, HTN, HLD, GERD, GI bleed with AVMs cauterized 06/2020, bladder outlet obstruction who was admitted from 12/26-12/29 with hepatic encephalopathy and GI bleed, now presenting from with acute encephalopathy, AKI, and bladder outlet obstruction.   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 He was feeling ill two days ago which may have prevented him from taking his lactulose. He is also having urinary retention which may be contributing. Nontender on exam, will hold ceftriaxone for now and check cells on paracentesis.  Last therapeutic paracentesis 1/20 with 6.8 L removed. From chart review appears he gets paracentesis almost every 10 days recently.    MELD 23, DF 23, no glucocorticoids indicated.   - restart lactulose at 30g qid, home dose is tid - goal of 2-3 stools per day  - holding lasix and spironolactone for AKI  - paracentesis ordered with labs  - albumin with paracentesis  - strict I/O, daily weights  - blood culture ordered in ER pending  AKI on CKD Stage  Urinary Retention  Patient with >450cc in the ER. He has a history of urinary retention in the past with foley required. AKI with Cr of 2.87, baseline about 1.1. He has been taking his lasix and spironolactone according to his brother, although his brother states the spiro is bid. Differential of AKI cause includes bladder outlet, overdiuresis 2/2 medications, and hepatorenal syndrome. His bp is soft. UA benign except for dark  urine. He received .5L in the ER, does appear dry on exam.     - hold lasix and spironolactone  - foley placed - strict I/O  - daily weights - renal US - hold nephrotoxic medications  - add Mag - s/p .5L NS bolus, will continue gentle fluids   History of Diabetes Most recent A1c 5.6 with patient not taking previous diabetes medications, these were stopped at previous discharge.  -currently NPO, will start SSI if needed once he has a diet   Anemia  MCV 93. Likely anemia of chronic disease. He does have hx of GI bleed but no symptoms at this time and hemoglobin is stable and at baseline.  - am ferritin, IBC, iron vitamin B12  Hypertension BP soft. Holding home medications  Gout -switch allopurinol to every other day for now  Diet: NPO pending paracentesis  VTE: SCDs IVF: none  Code: full  Dispo: Admit patient to Inpatient with expected length of stay greater than 2 midnights.  SignedMarty Heck, DO 09/18/2020, 12:52 PM  Pager: [phone]

## 2020-09-18 NOTE — ED Notes (Signed)
This RN to bedside to attempt to speak with patient regarding care and purpose/need for same, as soon as he was addressed he yelled "no" and then continued to state "god dangit". Patient moving all extremities freely in recliner chair. Patient continues to refuse care, answer questions, make eye contact with staff.

## 2020-09-18 NOTE — ED Notes (Signed)
Pt moaning and groaning and looked uncomfortable, unfortunately pt can not tell this RN what is going on. MD aware.

## 2020-09-18 NOTE — ED Notes (Signed)
Patient back to hall bed, in recliner chair. Patient refuses to look at staff, will not answer staff questions. Vitals stable. Patient just yells out "god dangit" when asked anything, patient opened eyes and moved extremities to light sternal rub then quickly went back to crossing arms across chest and refusing to open eyes.

## 2020-09-19 ENCOUNTER — Inpatient Hospital Stay (HOSPITAL_COMMUNITY): Payer: Medicare HMO

## 2020-09-19 DIAGNOSIS — N179 Acute kidney failure, unspecified: Secondary | ICD-10-CM

## 2020-09-19 DIAGNOSIS — G9340 Encephalopathy, unspecified: Secondary | ICD-10-CM

## 2020-09-19 DIAGNOSIS — I10 Essential (primary) hypertension: Secondary | ICD-10-CM

## 2020-09-19 DIAGNOSIS — M109 Gout, unspecified: Secondary | ICD-10-CM

## 2020-09-19 HISTORY — PX: IR PARACENTESIS: IMG2679

## 2020-09-19 LAB — SODIUM, URINE, RANDOM: Sodium, Ur: <10 mmol/L

## 2020-09-19 LAB — COMPREHENSIVE METABOLIC PANEL WITH GFR
ALT: 35 U/L (ref 0–44)
AST: 51 U/L — ABNORMAL HIGH (ref 15–41)
Albumin: 2.8 g/dL — ABNORMAL LOW (ref 3.5–5.0)
Alkaline Phosphatase: 84 U/L (ref 38–126)
Anion gap: 9 (ref 5–15)
BUN: 50 mg/dL — ABNORMAL HIGH (ref 8–23)
CO2: 23 mmol/L (ref 22–32)
Calcium: 8.6 mg/dL — ABNORMAL LOW (ref 8.9–10.3)
Chloride: 104 mmol/L (ref 98–111)
Creatinine, Ser: 2.84 mg/dL — ABNORMAL HIGH (ref 0.61–1.24)
GFR, Estimated: 23 mL/min — ABNORMAL LOW
Glucose, Bld: 162 mg/dL — ABNORMAL HIGH (ref 70–99)
Potassium: 3.2 mmol/L — ABNORMAL LOW (ref 3.5–5.1)
Sodium: 136 mmol/L (ref 135–145)
Total Bilirubin: 2.7 mg/dL — ABNORMAL HIGH (ref 0.3–1.2)
Total Protein: 5.8 g/dL — ABNORMAL LOW (ref 6.5–8.1)

## 2020-09-19 LAB — CBC
HCT: 28.2 % — ABNORMAL LOW (ref 39.0–52.0)
Hemoglobin: 9.7 g/dL — ABNORMAL LOW (ref 13.0–17.0)
MCH: 32.1 pg (ref 26.0–34.0)
MCHC: 34.4 g/dL (ref 30.0–36.0)
MCV: 93.4 fL (ref 80.0–100.0)
Platelets: 57 10*3/uL — ABNORMAL LOW (ref 150–400)
RBC: 3.02 MIL/uL — ABNORMAL LOW (ref 4.22–5.81)
RDW: 17 % — ABNORMAL HIGH (ref 11.5–15.5)
WBC: 4.8 10*3/uL (ref 4.0–10.5)
nRBC: 0 % (ref 0.0–0.2)

## 2020-09-19 LAB — GRAM STAIN

## 2020-09-19 LAB — IRON AND TIBC
Iron: 116 ug/dL (ref 45–182)
Saturation Ratios: 77 % — ABNORMAL HIGH (ref 17.9–39.5)
TIBC: 151 ug/dL — ABNORMAL LOW (ref 250–450)
UIBC: 35 ug/dL

## 2020-09-19 LAB — PROTEIN, PLEURAL OR PERITONEAL FLUID: Total protein, fluid: <3 g/dL

## 2020-09-19 LAB — FERRITIN: Ferritin: 119 ng/mL (ref 24–336)

## 2020-09-19 LAB — BODY FLUID CELL COUNT WITH DIFFERENTIAL
Eos, Fluid: 0 %
Lymphs, Fluid: 67 %
Monocyte-Macrophage-Serous Fluid: 14 % — ABNORMAL LOW (ref 50–90)
Neutrophil Count, Fluid: 19 % (ref 0–25)
Total Nucleated Cell Count, Fluid: 67 cu mm (ref 0–1000)

## 2020-09-19 LAB — GLUCOSE, CAPILLARY
Glucose-Capillary: 211 mg/dL — ABNORMAL HIGH (ref 70–99)
Glucose-Capillary: 146 mg/dL — ABNORMAL HIGH (ref 70–99)

## 2020-09-19 LAB — GLUCOSE, PLEURAL OR PERITONEAL FLUID: Glucose, Fluid: 176 mg/dL

## 2020-09-19 LAB — MAGNESIUM: Magnesium: 2.9 mg/dL — ABNORMAL HIGH (ref 1.7–2.4)

## 2020-09-19 LAB — LACTATE DEHYDROGENASE, PLEURAL OR PERITONEAL FLUID: LD, Fluid: 25 U/L — ABNORMAL HIGH (ref 3–23)

## 2020-09-19 LAB — CREATININE, URINE, RANDOM: Creatinine, Urine: 244.35 mg/dL

## 2020-09-19 MED ORDER — LIDOCAINE HCL 1 % IJ SOLN
INTRAMUSCULAR | Status: DC | PRN
  Administered 2020-09-19: 20 mL via INTRADERMAL

## 2020-09-19 MED ORDER — INSULIN ASPART 100 UNIT/ML ~~LOC~~ SOLN
0.0000 [IU] | Freq: Every day | SUBCUTANEOUS | Status: DC
  Administered 2020-09-21: 2 [IU] via SUBCUTANEOUS

## 2020-09-19 MED ORDER — LIDOCAINE HCL 1 % IJ SOLN
INTRAMUSCULAR | Status: AC
  Filled 2020-09-19: qty 20

## 2020-09-19 MED ORDER — LORAZEPAM 2 MG/ML IJ SOLN
1.0000 mg | Freq: Once | INTRAMUSCULAR | Status: AC
  Administered 2020-09-20: 1 mg via INTRAVENOUS
  Filled 2020-09-19: qty 1

## 2020-09-19 MED ORDER — POTASSIUM CHLORIDE 20 MEQ PO PACK
40.0000 meq | PACK | Freq: Two times a day (BID) | ORAL | Status: AC
  Administered 2020-09-19 (×2): 40 meq via ORAL
  Filled 2020-09-19 (×2): qty 2

## 2020-09-19 MED ORDER — LACTULOSE 10 GM/15ML PO SOLN: 30.0000 g | Freq: Three times a day (TID) | ORAL | Status: DC

## 2020-09-19 MED ORDER — ALLOPURINOL 100 MG PO TABS
50.0000 mg | ORAL_TABLET | ORAL | Status: DC
  Administered 2020-09-19 – 2020-09-21 (×2): 50 mg via ORAL
  Filled 2020-09-19 (×3): qty 1

## 2020-09-19 MED ORDER — GABAPENTIN 600 MG PO TABS
600.0000 mg | ORAL_TABLET | Freq: Two times a day (BID) | ORAL | Status: DC
Start: 2020-09-19 — End: 2020-09-22
  Administered 2020-09-19 – 2020-09-21 (×5): 600 mg via ORAL
  Filled 2020-09-19 (×5): qty 1

## 2020-09-19 MED ORDER — LACTULOSE 10 GM/15ML PO SOLN
30.0000 g | Freq: Three times a day (TID) | ORAL | Status: DC
  Administered 2020-09-19 – 2020-09-20 (×4): 30 g via ORAL
  Filled 2020-09-19 (×6): qty 45

## 2020-09-19 MED ORDER — INSULIN ASPART 100 UNIT/ML ~~LOC~~ SOLN
0.0000 [IU] | Freq: Three times a day (TID) | SUBCUTANEOUS | Status: DC
  Administered 2020-09-20 – 2020-09-21 (×4): 2 [IU] via SUBCUTANEOUS

## 2020-09-19 MED ORDER — ALBUMIN HUMAN 25 % IV SOLN
25.0000 g | Freq: Once | INTRAVENOUS | Status: AC
  Administered 2020-09-19: 25 g via INTRAVENOUS
  Filled 2020-09-19: qty 100

## 2020-09-19 NOTE — Progress Notes (Signed)
Patient calling out from his room to NT, crying and upset. Patient states he is, "dying." Prior to incident patient was talking to writer/RN in a normal tone and denying needs. Patient was attempting to sleep when the nurse left the room. Vital signs within normal limits bp-99/43, p-68, hr-20, t-97.9, rr-20.   Patient states he is upset because he wishes to talk to his brother. Staff have attempted to call the brother multiple times, throughout the day, without any returned or answered calls. Patient says he cant call his brother because the brother, "doesn't want anything to do with [him/pt]." Patient is inconsolable.    Intern Nancy Fetter text paged.

## 2020-09-19 NOTE — ED Notes (Signed)
Patient cleared to eat per his MD team. Patient has rung the call bell Q5 min for pillows x3, blankets x12, food, and positioning of the side table. Patient with pleasant demeanor, but very demanding. He perseverates about each thing until demand is met. MD team aware. They addressed his concerns about esophogeal procedure not being needed at this time. Patient also concerned about time of procedure. Patient to be updated when IR ready.

## 2020-09-19 NOTE — Progress Notes (Signed)
Pt arrived on the unit. VS stable. Alert, oriented to self and place, emotional, irritable, forgetful. Wants his bag to be next to him, and so "no one touch it"  Trying to call his brother x3  per pt's request. Line is busy. Will try again. Bed in low position,alarms are on, call bell in reach, continue to monitor.

## 2020-09-19 NOTE — Progress Notes (Signed)
Pt is off the floor, and will reattempt as time and pt allow   09/19/20 1400  PT Visit Information  Reason Eval/Treat Not Completed Patient at procedure or test/unavailable   Mee Hives, PT MS Acute Rehab Dept. Number: [place] [facility] [place]

## 2020-09-19 NOTE — Progress Notes (Signed)
[place] service received a page regarding this patient, upon chart review is a patient with the Internal Medicine teaching service. Placed IMTS contact info in this note.   *Please do not leave messages for physician in this sticky note. Please page appropriate physician as below.*   Weekday Hours (7AM-5PM):  1st Contact: Dr. Bridgett Larsson [phone]  2nd Contact: Dr. Sharon Seller: [phone]   ** If no return call within 15 minutes (after trying both pagers listed above), please call after hours pagers.   After 5 pm or weekends:  1st Contact: Pager: [phone]  2nd Contact: Pager: [phone]

## 2020-09-19 NOTE — ED Notes (Signed)
Patient continues to use call bell with frequency. Patient positioned in bed. Beverages and ice provided. Chux pads changed. Table moved. Patient turned and pillows/blankets positioned. Patient called out on call bell immediately after RN left bedside. Patient encouraged to finish what he can eat from lunch.  Patient complaining that he can't eat [place] from lunch tray. Apologized re: [place] and offered another warm sandwich, and patient refused at this time.

## 2020-09-19 NOTE — Procedures (Signed)
PROCEDURE SUMMARY:  Successful US guided paracentesis from RLQ.  Yielded 5.5 L of clear yellow fluid.  No immediate complications.  Pt tolerated well.   Specimen was sent for labs.  EBL < 74m  KAscencion DikePA-C 09/19/2020 4:47 PM

## 2020-09-19 NOTE — ED Notes (Signed)
Got patient a warm blanket patient is resting with call bell in reach

## 2020-09-19 NOTE — ED Notes (Signed)
OTF to IR for paracentesis via stretcher.

## 2020-09-19 NOTE — Progress Notes (Signed)
Subjective: Patient reports doing well. Not having abdominal pain, solely tightness. Extensive stool production overnight with increased Lactulose dose. Patient scheduled for paracentesis ~12:30 today. Endorsed healthy appetite. He was cleared to eat; and given sandwich and drink by RN.    Objective:  Vital signs in last 24 hours: Vitals:   09/19/20 1100 09/19/20 1130 09/19/20 1159 09/19/20 1300  BP: 114/74 117/72 126/60 (!) 125/59  Pulse: 66 69  81  Resp: 17 20  18   Temp:      TempSrc:      SpO2: 95% 95%  99%   Weight change:   Intake/Output Summary (Last 24 hours) at 09/19/2020 1414 Last data filed at 09/19/2020 0147 Gross per 24 hour  Intake 1138.29 ml  Output --  Net 1138.29 ml   Physical Exam Constitutional:      General: He is not in acute distress.    Appearance: He is not toxic-appearing.  Cardiovascular:     Rate and Rhythm: Normal rate and regular rhythm.  Abdominal:     General: There is distension.     Tenderness: There is no abdominal tenderness.     Comments: Taut, significantly distended abdomen  Musculoskeletal:     Right lower leg: No edema.     Left lower leg: No edema.  Feet:     Right foot:     Skin integrity: No ulcer or skin breakdown.     Left foot:     Skin integrity: No ulcer or skin breakdown.  Skin:    General: Skin is warm and dry.     Findings: Ecchymosis present.     Comments: Ecchymosis to arms   Neurological:     Mental Status: He is alert.  Psychiatric:        Mood and Affect: Mood normal.        Behavior: Behavior normal.      Assessment/Plan:  Active Problems:   Hepatic encephalopathy (HCC)  Brad [name] is a 70yo male with PMH of cirrhosis 2/2 NASH, variceal bleed s/p banding, HTN, HLD, GERD, GI bleed with AVMs cauterized 06/2020, bladder outlet obstruction who was admitted from 12/26-12/29 with hepatic encephalopathy and GI bleed, now admitted for acute encephalopathy, AKI, and bladder outlet obstruction.  Encephalopathy has resolved.   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 He was feeling ill two days ago which may have prevented him from taking his lactulose. He is also having urinary retention which may be contributing. Last therapeutic paracentesis 1/20 with 6.8 L removed. From chart review appears he gets paracentesis almost every 10 days recently. Started on Ceftriaxone for suspected SBP. Remains nontender on exam. Patient initially on increased lactulose dose (qid); with reported extensive stool production and now resolved encephalopathy. Paracentesis today.     MELD 25, DF 23, no glucocorticoids indicated.   - Reduced lactulose dose ; Home dose lactulose 30g tid - goal of 2-3 stools per day  -Rifaximin 550 mg BID -Discontinued Ceftriaxone in light of appropriate peritoneal fluid analysis - holding lasix and spironolactone for AKI  - albumin with paracentesis if  >5L removed - strict I/O, daily weights  -blood culture ordered in ER pending  AKI on CKD Stage  Urinary Retention  Patient with >450cc in the ER. He has a history of urinary retention in the past with foley required. AKI with Cr of 2.87, baseline about 1.1. He has been taking his lasix and spironolactone according to his  brother, although his brother states the spiro is bid. Differential of AKI cause includes bladder outlet, overdiuresis 2/2 medications, and hepatorenal syndrome. His bp is soft. UA benign except for dark urine. He received .5L in the ER, appeared dry on admission exam. Very mild improvement in creatinine at 2.84. Urine sodium <10, low FEna. Monitor for improvement in light of s/p paracentesis today.  - hold lasix and spironolactone  - foley  - strict I/O  - daily weights - renal US - hold nephrotoxic medications   History of Diabetes Most recent A1c 5.6 with patient not taking previous diabetes medications, these were stopped at previous  discharge. Previously NPO; transitioned today.  -SSI started  Anemia  MCV 93. Likely anemia of chronic disease. He does have hx of GI bleed but no symptoms at this time and hemoglobin is stable and at baseline.  - ferritin,TIBC, iron pending  Hypertension BP soft. Holding home medications  Gout switch allopurinol to every other day for now    LOS: 1 day   Brad [name], Medical Student 09/19/2020, 2:14 PM

## 2020-09-19 NOTE — ED Notes (Signed)
SDU Breakfast Order Placed

## 2020-09-20 DIAGNOSIS — N179 Acute kidney failure, unspecified: Secondary | ICD-10-CM | POA: Diagnosis present

## 2020-09-20 DIAGNOSIS — N189 Chronic kidney disease, unspecified: Secondary | ICD-10-CM | POA: Diagnosis present

## 2020-09-20 DIAGNOSIS — N1831 Chronic kidney disease, stage 3a: Secondary | ICD-10-CM

## 2020-09-20 DIAGNOSIS — K7290 Hepatic failure, unspecified without coma: Principal | ICD-10-CM

## 2020-09-20 DIAGNOSIS — D649 Anemia, unspecified: Secondary | ICD-10-CM

## 2020-09-20 DIAGNOSIS — R339 Retention of urine, unspecified: Secondary | ICD-10-CM | POA: Diagnosis present

## 2020-09-20 LAB — PH, BODY FLUID: pH, Body Fluid: 7.5

## 2020-09-20 LAB — CBC
HCT: 27.8 % — ABNORMAL LOW (ref 39.0–52.0)
Hemoglobin: 9.3 g/dL — ABNORMAL LOW (ref 13.0–17.0)
MCH: 31.4 pg (ref 26.0–34.0)
MCHC: 33.5 g/dL (ref 30.0–36.0)
MCV: 93.9 fL (ref 80.0–100.0)
Platelets: 52 10*3/uL — ABNORMAL LOW (ref 150–400)
RBC: 2.96 MIL/uL — ABNORMAL LOW (ref 4.22–5.81)
RDW: 16.8 % — ABNORMAL HIGH (ref 11.5–15.5)
WBC: 4.1 10*3/uL (ref 4.0–10.5)
nRBC: 0 % (ref 0.0–0.2)

## 2020-09-20 LAB — BASIC METABOLIC PANEL
Anion gap: 9 (ref 5–15)
BUN: 54 mg/dL — ABNORMAL HIGH (ref 8–23)
CO2: 22 mmol/L (ref 22–32)
Calcium: 8.7 mg/dL — ABNORMAL LOW (ref 8.9–10.3)
Chloride: 107 mmol/L (ref 98–111)
Creatinine, Ser: 2.53 mg/dL — ABNORMAL HIGH (ref 0.61–1.24)
GFR, Estimated: 27 mL/min — ABNORMAL LOW (ref >60)
Glucose, Bld: 148 mg/dL — ABNORMAL HIGH (ref 70–99)
Potassium: 3.7 mmol/L (ref 3.5–5.1)
Sodium: 138 mmol/L (ref 135–145)

## 2020-09-20 LAB — GLUCOSE, CAPILLARY
Glucose-Capillary: 128 mg/dL — ABNORMAL HIGH (ref 70–99)
Glucose-Capillary: 212 mg/dL — ABNORMAL HIGH (ref 70–99)
Glucose-Capillary: 218 mg/dL — ABNORMAL HIGH (ref 70–99)
Glucose-Capillary: 196 mg/dL — ABNORMAL HIGH (ref 70–99)

## 2020-09-20 MED ORDER — LACTULOSE 10 GM/15ML PO SOLN
30.0000 g | Freq: Four times a day (QID) | ORAL | Status: DC
  Administered 2020-09-20 – 2020-09-21 (×5): 30 g via ORAL
  Filled 2020-09-20 (×3): qty 45

## 2020-09-20 MED ORDER — ALBUMIN HUMAN 25 % IV SOLN
88.0000 g | Freq: Once | INTRAVENOUS | Status: AC
  Administered 2020-09-20: 87.5 g via INTRAVENOUS
  Filled 2020-09-20: qty 400

## 2020-09-20 MED ORDER — SALINE SPRAY 0.65 % NA SOLN
1.0000 | NASAL | Status: DC | PRN
  Filled 2020-09-20: qty 44

## 2020-09-20 MED ORDER — HYDROXYZINE HCL 25 MG PO TABS
25.0000 mg | ORAL_TABLET | Freq: Three times a day (TID) | ORAL | Status: DC | PRN
  Administered 2020-09-20 – 2020-09-21 (×3): 25 mg via ORAL
  Filled 2020-09-20 (×3): qty 1

## 2020-09-20 MED ORDER — CHLORHEXIDINE GLUCONATE CLOTH 2 % EX PADS
6.0000 | MEDICATED_PAD | Freq: Every day | CUTANEOUS | Status: DC
  Administered 2020-09-20: 6 via TOPICAL

## 2020-09-20 NOTE — Evaluation (Signed)
Occupational Therapy Evaluation Patient Details Name: Brad [name] MRN: [id number] DOB: [birth year] Today's Date: 09/20/2020    History of Present Illness 70 yo admitted 1/22 from home with emesis and AMS, hepatic encephalopathy. Pt s/p paracentesis 1/24. PMHx: cirrhosis due to NASH, HTN, HLD, GIB, encephalopathy   Clinical Impression   PTA, pt reports living in a converted garage apartment with bathroom, walking to mother's house (0.25 miles) for showering, IADL tasks. Pt reports Modified Independent with ADLs and mobility using cane, denies falls. Unsure of PLOF accuracy as pt poor historian and chart says he lives with brother. Pt noted to be laying in stool on entry, Max A for cleanup. Pt requires Min A for mobility in room using RW, multiple LOB when UE unsupported or turning requiring physical assist from therapist to correct and prevent fall. Pt requires Supervision for UB ADLs and Max A for LB ADLs due to deficits noted below. Pt also with some fine motor coordination deficits in B hands, wearing two different prefabricated braces (pt reports due to hands shaking at times). Based on functional abilities, recommend SNF for short term rehab to maximize safety and independence with tasks.     Follow Up Recommendations  SNF    Equipment Recommendations  Other (comment) (RW)    Recommendations for Other Services       Precautions / Restrictions Precautions Precautions: Fall Restrictions Weight Bearing Restrictions: No      Mobility Bed Mobility Overal bed mobility: Needs Assistance Bed Mobility: Supine to Sit     Supine to sit: Supervision;HOB elevated     General bed mobility comments: No assist needed to sit EOB    Transfers Overall transfer level: Needs assistance Equipment used: Rolling walker (2 wheeled) Transfers: Sit to/from Stand Sit to Stand: Min assist         General transfer comment: Min A for steadying with standing using RW. When turning head or  attempting to look around room, pt with multiple LOB, requiring at least Min A to correct. Min A needed to assist in safe turning of RW while maintaining balance    Balance Overall balance assessment: Needs assistance Sitting-balance support: No upper extremity supported;Feet supported Sitting balance-Leahy Scale: Fair     Standing balance support: Bilateral upper extremity supported;During functional activity Standing balance-Leahy Scale: Poor Standing balance comment: reliant on UE support for standing, LOB without UE support                           ADL either performed or assessed with clinical judgement   ADL Overall ADL's : Needs assistance/impaired Eating/Feeding: Set up;Sitting   Grooming: Supervision/safety;Sitting;Wash/dry face Grooming Details (indicate cue type and reason): Supervision, cues for initiating task Upper Body Bathing: Supervision/ safety;Sitting   Lower Body Bathing: Moderate assistance;Sit to/from stand   Upper Body Dressing : Minimal assistance;Sitting Upper Body Dressing Details (indicate cue type and reason): Min A to doff/don clean gown, noted with decreased digit coordination trying to remove sleeves Lower Body Dressing: Moderate assistance;Sit to/from stand   Toilet Transfer: Minimal assistance;Ambulation;RW Toilet Transfer Details (indicate cue type and reason): simulated in room Toileting- Clothing Manipulation and Hygiene: Maximal assistance;Sit to/from stand Toileting - Clothing Manipulation Details (indicate cue type and reason): Noted with bowel incontinence in bed, reports able to feel it but kept pointing to [facility] in room reporting he used this...reminded that stool was also in bed. Max A for cleanup     Functional mobility during  ADLs: Minimal assistance;Rolling walker;Cueing for sequencing;Cueing for safety General ADL Comments: Pt noted with impaired standing balance requiring physical assist to correct LOB to prevent fall.  Impaired cognition, noted in stool in bed but pt with difficulty acknowledging.     Vision Baseline Vision/History: Wears glasses Wears Glasses: At all times Patient Visual Report: No change from baseline Vision Assessment?: No apparent visual deficits     Perception     Praxis      Pertinent Vitals/Pain Pain Assessment: No/denies pain     Hand Dominance Right   Extremity/Trunk Assessment Upper Extremity Assessment Upper Extremity Assessment: RUE deficits/detail;LUE deficits/detail RUE Deficits / Details: Pt with prefab thumb spica brace. reports he wears this due to hand shaking during tasks at times. Decreased coordination RUE Coordination: decreased fine motor LUE Deficits / Details: Pt with prefab wrist cockup brace on this UE. Repors he wears this due to hands shaking at times. Fine motor coordination impaired but > R UE. LUE Coordination: decreased fine motor   Lower Extremity Assessment Lower Extremity Assessment: Defer to PT evaluation   Cervical / Trunk Assessment Cervical / Trunk Assessment: Normal   Communication Communication Communication: No difficulties   Cognition Arousal/Alertness: Awake/alert Behavior During Therapy: WFL for tasks assessed/performed;Flat affect;Impulsive Overall Cognitive Status: No family/caregiver present to determine baseline cognitive functioning Area of Impairment: Attention;Following commands;Safety/judgement;Awareness;Problem solving                   Current Attention Level: Selective   Following Commands: Follows one step commands with increased time Safety/Judgement: Decreased awareness of safety;Decreased awareness of deficits Awareness: Emergent Problem Solving: Slow processing;Difficulty sequencing;Requires verbal cues;Requires tactile cues General Comments: Pt able to report reason for coming to hospital due to fluid overload in belly. Noted to be laying in stool in bed - when asked about this, pt kept pointing to  [facility] and believed he had had BM there; redirected to stool in bed. Pt with decreased awareness of poor balance in standing, increased fall risk. Perseveration on how we would take catheter bag with [place], how we were able to walk away from bed with catheter bag despite repeated education   General Comments  VSS on RA. Pt reporting mild dizziness with initial sitting EOB that passed. Pt perseverating on talking to brother and having him take a day off of work to spend time with him.    Exercises     Shoulder Instructions      Home Living Family/patient expects to be discharged to:: Private residence Living Arrangements: Parent;Other relatives (mother, brother) Available Help at Discharge: Family;Available PRN/intermittently Type of Home: Other(Comment) (garage apartment) Home Access: Stairs to enter [employer] of Steps: 1   Home Layout: One level         Biochemist, clinical: [place] - single point;Bedside commode   Additional Comments: Pt reports living in garage that has been converted into apartment, has bathroom and office. Pt reports walking to his mom's house up the road to shower (0.25 miles), walk in shower with shower seat      Prior Functioning/Environment Level of Independence: Needs assistance  Gait / Transfers Assistance Needed: reports mobility with cane, denies falls ADL's / Homemaking Assistance Needed: uses BSC at night, has another bathroom in/around garage apartment. Says he uses BSC at night so he doesnt have to walk in the cold. Reports able to bathe, dress self. Walks to [employer] for showering, does his laundry there too.  Comments: unsure of PLOF accuracy, per chart pt lives with brother        OT Problem List: Decreased strength;Decreased activity tolerance;Impaired balance (sitting and/or standing);Decreased coordination;Decreased cognition;Decreased safety awareness;Decreased knowledge of use of DME or AE      OT  Treatment/Interventions: Self-care/ADL training;Therapeutic exercise;DME and/or AE instruction;Therapeutic activities;Patient/family education;Balance training    OT Goals(Current goals can be found in the care plan section) Acute Rehab OT Goals Patient Stated Goal: talk to brother OT Goal Formulation: With patient Time For Goal Achievement: 10/04/20 Potential to Achieve Goals: Good ADL Goals Pt Will Perform Grooming: with set-up;standing Pt Will Perform Lower Body Bathing: with set-up;sitting/lateral leans;sit to/from stand Pt Will Perform Lower Body Dressing: with set-up;sitting/lateral leans;sit to/from stand Pt Will Transfer to Toilet: with set-up;ambulating;regular height toilet Pt/caregiver will Perform Home Exercise Program: Increased strength;Both right and left upper extremity;With theraband;With theraputty;Independently;With written HEP provided  OT Frequency: Min 2X/week   Barriers to D/C:            Co-evaluation              AM-PAC OT "6 Clicks" Daily Activity     Outcome Measure Help from another person eating meals?: A Little Help from another person taking care of personal grooming?: A Little Help from another person toileting, which includes using toliet, bedpan, or urinal?: A Lot Help from another person bathing (including washing, rinsing, drying)?: A Lot Help from another person to put on and taking off regular upper body clothing?: A Little Help from another person to put on and taking off regular lower body clothing?: A Lot 6 Click Score: 15   End of Session Equipment Utilized During Treatment: Gait belt;Rolling walker Nurse Communication: Mobility status;Other (comment) (stool in bed, cognition, use gait belt)  Activity Tolerance: Patient tolerated treatment well Patient left: in chair;with call bell/phone within reach;with chair alarm set  OT Visit Diagnosis: Unsteadiness on feet (R26.81);Other abnormalities of gait and mobility (R26.89);Muscle  weakness (generalized) (M62.81);Other symptoms and signs involving cognitive function                Time: [id number] OT Time Calculation (min): 33 min Charges:  OT General Charges $OT Visit: 1 Visit OT Evaluation $OT Eval Moderate Complexity: 1 Mod OT Treatments $Self Care/Home Management : 8-22 mins  Layla Maw, OTR/L  Layla Maw 09/20/2020, 8:36 AM

## 2020-09-20 NOTE — Progress Notes (Addendum)
Subjective: Evaluated by PT and OT, recommending SNF.  Patient seen waking up from sleep. Happy regarding reduced abdominal girth s/p paracentesis yesterday. He expressed displeasure at having missed his ophthalmologist appointment, but that he will get this rescheduled. Has other appointments planned for later this week. Patient conveys he would be okay with SNF; expresses financial concerns regarding this.     Objective:  Vital signs in last 24 hours: Vitals:   09/19/20 1927 09/20/20 0500 09/20/20 0507 09/20/20 0728  BP: (!) 99/43  (!) 103/50 (!) 112/57  Pulse: 68  60   Resp: 20  20 13   Temp: 97.9 F (36.6 C)  98 F (36.7 C) 98 F (36.7 C)  TempSrc: Oral  Axillary Oral  SpO2: 100%  99% 99%  Weight: 87 kg 88 kg    Height: 5' 10"  (1.778 m)      Weight change:   Intake/Output Summary (Last 24 hours) at 09/20/2020 1505 Last data filed at 09/20/2020 1049 Gross per 24 hour  Intake 583 ml  Output 700 ml  Net -117 ml   Physical Exam Constitutional:      General: He is not in acute distress.    Appearance: Normal appearance. He is not diaphoretic.  HENT:     Mouth/Throat:     Mouth: Mucous membranes are moist.  Cardiovascular:     Rate and Rhythm: Normal rate and regular rhythm.  Abdominal:     General: There is distension (less distended than prior exam).     Palpations: Abdomen is soft.  Musculoskeletal:     Comments: Mild asterixis  Neurological:     Mental Status: He is alert.  Psychiatric:        Mood and Affect: Mood normal.      Assessment/Plan:  Active Problems:   Hepatic encephalopathy (HCC)  Brad [name] is a 70yo male with PMH of cirrhosis 2/2 NASH, variceal bleed s/p banding, HTN, HLD, GERD, GI bleed with AVMs cauterized 06/2020, bladder outlet obstruction who was admitted from 12/26-12/29 with hepatic encephalopathy and GI bleed, now admitted for acute encephalopathy, AKI, and bladder outlet obstruction. Encephalopathy has resolved.   Hepatic  Encephalopathy 2/2 NASH Cirrhosis; Resolved  Per brother patient has likely been taking medications. He was feeling ill two days prior to admission which may have prevented him from taking his lactulose; also endorsed urinary retention which may have contributed. Therapeutic paracentesis 1/20 with 6.8 L removed. From chart review appears he gets paracentesis almost every 10 days recently. He was initially started on Ceftriaxone for suspected SBP. Patient initially on increased lactulose dose (qid); with reported extensive stool production and subsequently resolved encephalopathy. Paracentesis yesterday with 5.5L clear fluid removed. Received albumin following. Discontinued Ceftriaxone yesterday in light of no evidence of SBP on peritoneal fluid analysis and no evidence of GI bleeding. Blood cultures without growth over two days duration. Patient remains nontender on exam, with reduced abdominal distension.       -Home dose lactulose 30g tid - goal of 2-3 stools per day  -Rifaximin 550 mg BID -Appreciate PT & OT: recommending SNF -holding lasix and spironolactone for AKI  -strict I/O, daily weights   AKI on CKD Stage 3a Urinary Retention  Patient with >450cc in the ER. He has a history of urinary retention in the past with foley required. AKI with Cr of 2.87, baseline about 1.1. He has been taking his lasix and spironolactone according to his brother, although his brother states the spiro is bid. Differential  of AKI cause includes bladder outlet, overdiuresis 2/2 medications, and hepatorenal syndrome. His bp is soft. UA benign except for dark urine. Renal ultrasound unremarkable. He has received some gentle fluids. Received some albumin following paracentesis yesterday. Urine sodium <10, low FEna. Creatinine improved to 2.53 day from 2.84. Patient has been eating and drinking fluids.  -Additional albumin today, 1 g/kg (will round down to 75 g); monitor Cr - f/u bladder scan - foley removal with PVR  measurement - hold lasix and spironolactone  - strict I/O  - daily weights - hold nephrotoxic medications   History of Diabetes Most recent A1c 5.6 with patient not taking previous diabetes medications, these were stopped at previous discharge.  -SSI    Anemia likely 2/2 Chronic Disease Likely anemia of chronic disease. He does have hx of GI bleed but no symptoms at this time and hemoglobin is stable and at baseline. Normocytic. Ferritin Normal (119), TIBC low (151) -continued monitoring    Hypertension BP soft. Holding home medications    Gout allopurinol every other day for now   LOS: 2 days   Brad [name], Medical Student 09/20/2020, 3:05 PM

## 2020-09-20 NOTE — NC FL2 (Signed)
[place] LEVEL OF CARE SCREENING TOOL     IDENTIFICATION  Patient Name: Brad [name] Birthdate: 06-10-1951 Sex: male Admission Date (Current Location): 09/17/2020  [facility] and [place] Number:  Herbalist and Address:  [facility] [place], [place] [address] [place], [place] [address]      Provider Number: [id number]  Attending Physician Name and Address:  Oda Kilts, MD  Relative Name and Phone Number:  Million, Maharaj   [phone]    Current Level of Care: Hospital Recommended Level of Care: [place] Prior Approval Number:    Date Approved/Denied:   PASRR Number:    Discharge Plan: SNF    Current Diagnoses: Patient Active Problem List   Diagnosis Date Noted  . Diabetes ([place]) 08/24/2020  . Hepatic encephalopathy ([place]) 08/21/2020  . Abnormal stress test 07/24/2017  . Anginal chest pain at rest [facility]) 07/24/2017  . Benign essential hypertension 11/09/2015  . Cirrhosis ([place]) 11/09/2015  . Hyperlipidemia 11/09/2015  . Insulin long-term use ([place]) 11/09/2015  . OSA (obstructive sleep apnea) 11/09/2015  . Uncontrolled type 2 diabetes mellitus without complication, with long-term current use of insulin 11/09/2015  . 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 Neck pain on left side 04/28/2013    Orientation RESPIRATION BLADDER Height & Weight     Self,Time,Place  Normal Incontinent Weight: 194 lb 0.1 oz (88 kg) Height:  5' 10"  (177.8 cm)  BEHAVIORAL SYMPTOMS/MOOD NEUROLOGICAL BOWEL NUTRITION STATUS      Continent Diet (Regular diet.  See discharge summary)  AMBULATORY STATUS COMMUNICATION OF NEEDS Skin   Limited Assist Verbally Normal                       Personal Care Assistance Level of Assistance  Bathing,Feeding,Dressing Bathing Assistance: Limited assistance Feeding assistance: Independent Dressing Assistance: Limited assistance     Functional Limitations Info  Sight,Hearing,Speech Sight Info:  Adequate Hearing Info: Adequate Speech Info: Adequate    SPECIAL CARE FACTORS FREQUENCY  PT (By licensed PT),OT (By licensed OT)     PT Frequency: 5x week OT Frequency: 5x week            Contractures Contractures Info: Not present    Additional Factors Info  Code Status,Allergies,Insulin Sliding Scale Code Status Info: full Allergies Info: Lisinopril, Penicillins   Insulin Sliding Scale Info: Novolog. 0-6 units 3x day with meals, 0-5 units bedtime.       Current Medications (09/20/2020):  This is the current hospital active medication list Current Facility-Administered Medications  Medication Dose Route Frequency Provider Last Rate Last Admin  . albumin human 25 % solution 88 g  88 g Intravenous Once Andrew Au, MD      . allopurinol (ZYLOPRIM) tablet 50 mg  50 mg Oral QODAY Seawell, Jaimie A, DO   50 mg at 09/19/20 1731  . Chlorhexidine Gluconate Cloth 2 % PADS 6 each  6 each Topical Daily Oda Kilts, MD   6 each at 09/20/20 [phone]  . gabapentin (NEURONTIN) tablet 600 mg  600 mg Oral BID Seawell, Jaimie A, DO   600 mg at 09/20/20 0837  . insulin aspart (novoLOG) injection 0-5 Units  0-5 Units Subcutaneous QHS Seawell, Jaimie A, DO      . insulin aspart (novoLOG) injection 0-6 Units  0-6 Units Subcutaneous TID WC Seawell, Jaimie A, DO   2 Units at 09/20/20 1205  . lactulose (CHRONULAC) 10 GM/15ML solution 30 g  30 g Oral TID Seawell, Jaimie A, DO   30 g at 09/20/20 0837  . lidocaine (XYLOCAINE) 1 % (with pres) injection    PRN Ascencion Dike, PA-C   20 mL at 09/19/20 1203  . rifaximin (XIFAXAN) tablet 550 mg  550 mg Oral BID Seawell, Jaimie A, DO   550 mg at 09/20/20 0837  . sodium chloride (OCEAN) 0.65 % nasal spray 1 spray  1 spray Each Nare PRN Oda Kilts, MD      . sodium chloride flush (NS) 0.9 % injection 3 mL  3 mL Intravenous Q12H Seawell, Jaimie A, DO   3 mL at 09/20/20 4739     Discharge Medications: Please see discharge summary for a list of  discharge medications.  Relevant Imaging Results:  Relevant Lab Results:   Additional Information SSN [id number]  Joanne Chars, LCSW

## 2020-09-20 NOTE — TOC Initial Note (Signed)
Transition of Care [facility]) - Initial/Assessment Note    Patient Details  Name: Brad [name] MRN: [id number] Date of Birth: [birth year]  Transition of Care [facility]) CM/SW Contact:    Brad Chars, LCSW Phone Number: 09/20/2020, 4:18 PM  Clinical Narrative:   CSW here for encephalopathy, able to participate in assessment, reports he would prefer to return home, particularly concerned about missing upcoming MD appts.  Permission given to speak with brother, Brad [name]. Unsure on Covid vaccine--pt report he received one during this hospitalization.    CSW spoke with brother Brad [name].  Pt wife asked him to leave last September.  Pt has been staying in an unattached garage at brother's house since then.  The home is small, 2 bedrooms and 40 year old mother is in the other bedroom.  Garage is 200 yards from the house, Brad [name] works, so supervision 24/7 is not very possible since there is no room for pt in the home.                Expected Discharge Plan: Skilled Nursing Facility Barriers to Discharge: Continued Medical Work up,SNF Pending bed offer   Patient Goals and CMS Choice   CMS Medicare.gov Compare Post Acute Care list provided to:: Patient Choice offered to / list presented to : Patient  Expected Discharge Plan and Services Expected Discharge Plan: [place] Choice: [place] arrangements for the past 2 months: Single Family Home                                      Prior Living Arrangements/Services Living arrangements for the past 2 months: Single Family Home Lives with:: Siblings,Parents Patient language and need for interpreter reviewed:: Yes Do you feel safe going back to the place where you live?: Yes      Need for Family Participation in Patient Care: Yes (Comment) Care giver support system in place?: Yes (comment) Current home services: Other (comment) (none) Criminal Activity/Legal Involvement Pertinent  to Current Situation/Hospitalization: No - Comment as needed  Activities of Daily Living   ADL Screening (condition at time of admission) Patient's cognitive ability adequate to safely complete daily activities?: No Is the patient deaf or have difficulty hearing?: No Does the patient have difficulty seeing, even when wearing glasses/contacts?: Yes Does the patient have difficulty concentrating, remembering, or making decisions?: Yes Patient able to express need for assistance with ADLs?: Yes Does the patient have difficulty dressing or bathing?: Yes Independently performs ADLs?: No Does the patient have difficulty walking or climbing stairs?: Yes Weakness of Legs: Both Weakness of Arms/Hands: None  Permission Sought/Granted Permission sought to share information with : Family Supports Permission granted to share information with : Yes, Verbal Permission Granted  Share Information with NAME: brother, Brad [name]           Emotional Assessment Appearance:: Appears stated age Attitude/Demeanor/Rapport: Irrational Affect (typically observed): Frustrated Orientation: : Oriented to Self,Oriented to Place,Oriented to  Time Alcohol / Substance Use: Not Applicable Psych Involvement: No (comment)  Admission diagnosis:  Hepatic encephalopathy ([place]) [K72.90] Encephalopathy acute [G93.40] Ascites [R18.8] Bladder obstruction [N32.0] Patient Active Problem List   Diagnosis Date Noted  . Diabetes ([place]) 08/24/2020  . Hepatic encephalopathy ([place]) 08/21/2020  . Abnormal stress test 07/24/2017  . Anginal chest pain at rest [facility]) 07/24/2017  . Benign essential hypertension 11/09/2015  . Cirrhosis ([place])  11/09/2015  . Hyperlipidemia 11/09/2015  . Insulin long-term use ([place]) 11/09/2015  . OSA (obstructive sleep apnea) 11/09/2015  . Uncontrolled type 2 diabetes mellitus without complication, with long-term current use of insulin 11/09/2015  . 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 Neck pain on left side  04/28/2013   PCP:  Brad Axe, MD Pharmacy:   [id number] [place], [place] - [name] [id number]: [phone]: [phone] [facility] [address] [facility] [address]Phone: [phone]: [phone]    Social Determinants of Health (SDOH) Interventions    Readmission Risk Interventions No flowsheet data found.

## 2020-09-20 NOTE — Evaluation (Signed)
Physical Therapy Evaluation Patient Details Name: Brad [name] MRN: [id number] DOB: [birth year] Today's Date: 09/20/2020   History of Present Illness  70 yo admitted 1/22 from home with emesis and AMS, hepatic encephalopathy. Pt s/p paracentesis 1/24. PMHx: cirrhosis due to NASH, HTN, HLD, GIB, encephalopathy  Clinical Impression  Pt in bed on arrival after having only stayed in chair briefly after OT session. Pt with decreased attention, memory, impaired problem solving, gait and balance who will benefit from acute therapy to maximize mobility, safety and function to decrease burden of care. Pt requires 24hr supervision alone with min assist and RW for gait. Pt currently lives in garage room attached to brother/ mom's trailer per pt report. If pt can be in home with 24hr supervision then return home feasible but if not SNF recommended.     Follow Up Recommendations SNF;Supervision/Assistance - 24 hour (if family able to provide 24hr then HHPT feasible)    Equipment Recommendations  Rolling walker with 5" wheels    Recommendations for Other Services       Precautions / Restrictions Precautions Precautions: Fall      Mobility  Bed Mobility Overal bed mobility: Needs Assistance Bed Mobility: Supine to Sit;Sit to Supine     Supine to sit: HOB elevated;Supervision Sit to supine: Supervision   General bed mobility comments: cues for safety and assist for catheter    Transfers Overall transfer level: Needs assistance   Transfers: Sit to/from Stand;Stand Pivot Transfers Sit to Stand: Min guard Stand pivot transfers: Min guard       General transfer comment: guarding for safety and lines with cues for hand placement. pt pivoted bed<>BSC with single UE support on railing  Ambulation/Gait Ambulation/Gait assistance: Min assist Gait Distance (Feet): 300 Feet Assistive device: Rolling walker (2 wheeled) Gait Pattern/deviations: Step-through pattern;Decreased stride length;Trunk  flexed   Gait velocity interpretation: 1.31 - 2.62 ft/sec, indicative of limited community ambulator General Gait Details: cues for posture, position in RW and direction with assist to avoid running into obstacles particularly toward right . 2 partial LOB with assist to correct  Stairs            Wheelchair Mobility    Modified Rankin (Stroke Patients Only)       Balance Overall balance assessment: Needs assistance Sitting-balance support: No upper extremity supported;Feet supported Sitting balance-Leahy Scale: Fair     Standing balance support: Bilateral upper extremity supported;During functional activity Standing balance-Leahy Scale: Poor Standing balance comment: reliant on UE support, RW for gait                             Pertinent Vitals/Pain Pain Assessment: No/denies pain    Home Living Family/patient expects to be discharged to:: Private residence Living Arrangements: Other relatives;Parent (mom and brother live in house) Available Help at Discharge: Family Type of Home: Mobile home (garage apartment of brother's house) Home Access: Stairs to enter   Technical brewer of Steps: 3 Home Layout: One level Home Equipment: Environmental consultant - 2 wheels;Bedside commode      Prior Function Level of Independence: Needs assistance   Gait / Transfers Assistance Needed: walks without assist, cane on occasion  ADL's / Homemaking Assistance Needed: goes in the house to shower, mom does the cooking  Comments: pt is in a garage apartment beside mobile home where brother and mother live     Hand Dominance        Extremity/Trunk Assessment  Upper Extremity Assessment Upper Extremity Assessment: Defer to OT evaluation    Lower Extremity Assessment Lower Extremity Assessment: Generalized weakness    Cervical / Trunk Assessment Cervical / Trunk Assessment: Other exceptions Cervical / Trunk Exceptions: rounded shoulders  Communication    Communication: No difficulties  Cognition Arousal/Alertness: Awake/alert Behavior During Therapy: WFL for tasks assessed/performed Overall Cognitive Status: No family/caregiver present to determine baseline cognitive functioning Area of Impairment: Attention;Following commands;Safety/judgement;Awareness;Problem solving;Memory                   Current Attention Level: Sustained Memory: Decreased short-term memory Following Commands: Follows one step commands with increased time Safety/Judgement: Decreased awareness of safety;Decreased awareness of deficits   Problem Solving: Slow processing;Requires verbal cues General Comments: pt unable to recall room number during gait despite education x 5 and unable to wayfind. Pt with limited STM and will forget what he was just told regarding function and safety within minutes. Pt with slightly varied report of PLOF      General Comments      Exercises     Assessment/Plan    PT Assessment Patient needs continued PT services  PT Problem List Decreased mobility;Decreased strength;Decreased safety awareness;Decreased activity tolerance;Decreased cognition;Decreased balance;Decreased knowledge of use of DME       PT Treatment Interventions DME instruction;Gait training;Functional mobility training;Therapeutic activities;Patient/family education;Stair training;Cognitive remediation;Balance training;Therapeutic exercise    PT Goals (Current goals can be found in the Care Plan section)  Acute Rehab PT Goals Patient Stated Goal: get home PT Goal Formulation: With patient Time For Goal Achievement: 10/04/20 Potential to Achieve Goals: Fair    Frequency Min 2X/week   Barriers to discharge Decreased caregiver support      Co-evaluation               AM-PAC PT "6 Clicks" Mobility  Outcome Measure Help needed turning from your back to your side while in a flat bed without using bedrails?: A Little Help needed moving from lying  on your back to sitting on the side of a flat bed without using bedrails?: A Little Help needed moving to and from a bed to a chair (including a wheelchair)?: A Little Help needed standing up from a chair using your arms (e.g., wheelchair or bedside chair)?: A Little Help needed to walk in hospital room?: A Little Help needed climbing 3-5 steps with a railing? : A Little 6 Click Score: 18    End of Session Equipment Utilized During Treatment: Gait belt Activity Tolerance: Patient tolerated treatment well Patient left: in bed;with call bell/phone within reach;with bed alarm set Nurse Communication: Mobility status PT Visit Diagnosis: Other abnormalities of gait and mobility (R26.89);Difficulty in walking, not elsewhere classified (R26.2)    Time: [id number] PT Time Calculation (min) (ACUTE ONLY): 23 min   Charges:   PT Evaluation $PT Eval Moderate Complexity: 1 Mod PT Treatments $Gait Training: 8-22 mins        Shaolin Armas P, PT Acute Rehabilitation Services Pager: [phone] Office: [phone]   Jaiah Weigel B Bailea Beed 09/20/2020, 1:09 PM

## 2020-09-21 LAB — BASIC METABOLIC PANEL
Anion gap: 8 (ref 5–15)
Anion gap: 10 (ref 5–15)
BUN: 58 mg/dL — ABNORMAL HIGH (ref 8–23)
BUN: 56 mg/dL — ABNORMAL HIGH (ref 8–23)
CO2: 21 mmol/L — ABNORMAL LOW (ref 22–32)
CO2: 21 mmol/L — ABNORMAL LOW (ref 22–32)
Calcium: 8.8 mg/dL — ABNORMAL LOW (ref 8.9–10.3)
Calcium: 9.1 mg/dL (ref 8.9–10.3)
Chloride: 106 mmol/L (ref 98–111)
Chloride: 106 mmol/L (ref 98–111)
Creatinine, Ser: 2.42 mg/dL — ABNORMAL HIGH (ref 0.61–1.24)
Creatinine, Ser: 2.6 mg/dL — ABNORMAL HIGH (ref 0.61–1.24)
GFR, Estimated: 28 mL/min — ABNORMAL LOW (ref >60)
GFR, Estimated: 26 mL/min — ABNORMAL LOW (ref >60)
Glucose, Bld: 173 mg/dL — ABNORMAL HIGH (ref 70–99)
Glucose, Bld: 214 mg/dL — ABNORMAL HIGH (ref 70–99)
Potassium: 3.7 mmol/L (ref 3.5–5.1)
Potassium: 3.7 mmol/L (ref 3.5–5.1)
Sodium: 135 mmol/L (ref 135–145)
Sodium: 137 mmol/L (ref 135–145)

## 2020-09-21 LAB — CBC
HCT: 25.3 % — ABNORMAL LOW (ref 39.0–52.0)
Hemoglobin: 8.7 g/dL — ABNORMAL LOW (ref 13.0–17.0)
MCH: 32.2 pg (ref 26.0–34.0)
MCHC: 34.4 g/dL (ref 30.0–36.0)
MCV: 93.7 fL (ref 80.0–100.0)
Platelets: 50 10*3/uL — ABNORMAL LOW (ref 150–400)
RBC: 2.7 MIL/uL — ABNORMAL LOW (ref 4.22–5.81)
RDW: 16.4 % — ABNORMAL HIGH (ref 11.5–15.5)
WBC: 3.9 10*3/uL — ABNORMAL LOW (ref 4.0–10.5)
nRBC: 0 % (ref 0.0–0.2)

## 2020-09-21 LAB — GLUCOSE, CAPILLARY
Glucose-Capillary: 131 mg/dL — ABNORMAL HIGH (ref 70–99)
Glucose-Capillary: 206 mg/dL — ABNORMAL HIGH (ref 70–99)
Glucose-Capillary: 212 mg/dL — ABNORMAL HIGH (ref 70–99)
Glucose-Capillary: 234 mg/dL — ABNORMAL HIGH (ref 70–99)

## 2020-09-21 LAB — PATHOLOGIST SMEAR REVIEW

## 2020-09-21 MED ORDER — LACTATED RINGERS IV SOLN: INTRAVENOUS | Status: DC

## 2020-09-21 MED ORDER — ALBUMIN HUMAN 25 % IV SOLN: 88.0000 g | Freq: Once | INTRAVENOUS | Status: DC

## 2020-09-21 MED ORDER — ALBUMIN HUMAN 25 % IV SOLN
87.5000 g | Freq: Once | INTRAVENOUS | Status: AC
  Administered 2020-09-21: 87.5 g via INTRAVENOUS
  Filled 2020-09-21 (×2): qty 350

## 2020-09-21 NOTE — Progress Notes (Signed)
Notified by pt nurse that pt wants to leave hospital tonight. I asked pt if there is anything we can do to make him stay overnight, he stated "No, I am going home tonight". Pt called taxi and left floor at 2230.  Pt alert and oriented x4, in no acute distress. He received education regarding his condition, and not being medically clear to leave hospital at this time. He also understands that insurance won't cover his hospital stay if he leaves AMA. Pt understands all of above.

## 2020-09-21 NOTE — Progress Notes (Signed)
Patient very agitated wanted me to call his brother and I did call  [name] explained to me that he was in the bed and had taken pain medication and not able to come I asked him to tell patient this and gave him the telephone. After conversation with MD AMA papers were given I also gave pair of paper scrubs pants. I explained that its cold outside he stated he didn't care he had long johns he could wear. He signed AMA and it was placed in the chart. Arthor Captain LPN

## 2020-09-21 NOTE — Progress Notes (Signed)
Paged by patient's RN, Verdene Lennert, that patient is anxious and angry. He has pulled out his IV and refusing to have a new IV placed.   I went to bedside to discuss with patient his concerns. He states that he has been lied to by the male doctor this morning, who told him he can go home. I explained that his kidney function worsened and we needed to give him IV fluids and that his the reason for not being discharged. Mr. Bondar states he is peeing so he doesn't believe me. He repeatedly states he is tired of being lied to and no one cares about him. He spoke to his brother via telephone and Mr. Goodchild is upset that his brother is unwilling to come pick him up. He feels his brother is selfish.   I expressed to Mr. Obrecht that leaving right now is against medical advice and is placing himself in harm's way. He states he doesn't care. I asked if he has any thoughts of hurting himself and he adamantly denied this. He says he wishes things were different but just doesn't feel like he's getting the right care. I asked if there is anything I can do to convince him to stay and he states no.   Patient is alert and oriented. He understands the dangers of leaving and would like to leave AMA regardless. Discussed with Verdene Lennert to bring him AMA paperwork.     Dr. Jose Persia Internal Medicine PGY-2  Pager: [phone] After 5pm on weekdays and 1pm on weekends: On Call pager [phone]  09/21/2020, 10:24 PM

## 2020-09-21 NOTE — TOC Progression Note (Signed)
Transition of Care [facility]) - Progression Note    Patient Details  Name: NAKIA KOBLE MRN: [id number] Date of Birth: 17-Aug-1951  Transition of Care [facility]) CM/SW Contact  Joanne Chars, LCSW Phone Number: 09/21/2020, 6:54 PM  Clinical Narrative:   Annia Friendly received: [phone]    Expected Discharge Plan: [place] Barriers to Discharge: Continued Medical Work up,SNF Pending bed offer  Expected Discharge Plan and Services Expected Discharge Plan: [place] Choice: [place] arrangements for the past 2 months: Single Family Home                                       Social Determinants of Health (SDOH) Interventions    Readmission Risk Interventions No flowsheet data found.

## 2020-09-21 NOTE — Progress Notes (Addendum)
Subjective: ON: More confused per brother. Some agitation, requiring hydroxyzine. Lactulose increased back to QID.   Patient reports doing well. He expressed worry about potentially missing his upcoming pain management appointment 11 AM tomorrow; having missed his ophthalmology appointment. No additional concerns.  Objective:  Vital signs in last 24 hours: Vitals:   09/20/20 2001 09/21/20 0504 09/21/20 0527 09/21/20 1436  BP: 111/61 114/66  (!) 110/54  Pulse: 66 74  61  Resp: 20 20  16   Temp: 97.7 F (36.5 C) 97.8 F (36.6 C)  97.7 F (36.5 C)  TempSrc: Oral Oral    SpO2: 95% 97%  98%  Weight:   89.4 kg   Height:       Weight change: 2.4 kg  Intake/Output Summary (Last 24 hours) at 09/21/2020 1507 Last data filed at 09/20/2020 1640 Gross per 24 hour  Intake 350 ml  Output 400 ml  Net -50 ml   Physical Exam Constitutional:      General: He is not in acute distress.    Appearance: Normal appearance. He is not toxic-appearing or diaphoretic.  Cardiovascular:     Rate and Rhythm: Normal rate and regular rhythm.  Pulmonary:     Effort: Pulmonary effort is normal. No respiratory distress.  Abdominal:     General: Bowel sounds are increased. There is distension.     Palpations: Abdomen is soft.     Tenderness: There is no abdominal tenderness. There is no guarding.  Skin:    Findings: Ecchymosis present.     Comments: Scattered echymosis  Neurological:     Mental Status: He is alert.  Psychiatric:        Mood and Affect: Mood normal. Mood is not anxious or elated.        Behavior: Behavior is cooperative.        Thought Content: Thought content is not delusional.     Assessment/Plan:  Principal Problem:   Hepatic encephalopathy (HCC) Active Problems:   Cirrhosis ([place])   Acute-on-chronic kidney injury ([place])   Urinary retention  Brad [name] is a 70yo male with PMH of cirrhosis 2/2 NASH, variceal bleed s/p banding, HTN, HLD, GERD, GI bleed with AVMs  cauterized 06/2020, bladder outlet obstruction who was admitted from 12/26-12/29 with hepatic encephalopathy and GI bleed, now admitted for acute encephalopathy, AKI, and bladder outlet obstruction.   Hepatic Encephalopathy 2/2 NASH Cirrhosis Per brother patient has likely been taking medications. He was feeling ill two days prior to admission which may have prevented him from taking his lactulose; also endorsed urinary retention which may have contributed. Paracentesis this hospital course with 5.5L clear fluid removed. Received albumin following. Discontinued Ceftriaxone during course in light of no evidence of SBP on peritoneal fluid analysis and no evidence of GI bleeding. Blood cultures without growth over two days duration. Patient agitated overnight, and cognitively inappropriate per brother; lactulose consequently increased back to QID. Patient remains nontender on exam, with reduced abdominal distension from admission.       -Lactulose 30g dose increased to QID - goal of 2-3 stools per day  -Rifaximin 550 mg BID -Appreciate PT & OT: recommending SNF -Appreciate Social work -holding lasix and spironolactone for AKI  -strict I/O, daily weights   AKI on CKD Stage 3a  Urinary Retention  He has a history of urinary retention in the past with foley required. AKI with Cr of 2.87, baseline about 1.1. Differential of AKI cause includes bladder outlet, overdiuresis 2/2 medications, and hepatorenal  syndrome. His bp is soft. UA benign except for dark urine. Renal ultrasound unremarkable. He has received some gentle fluids. Received some albumin following paracentesis, and additional 88g after. Urine sodium <10, low FEna. Creatinine has been improving and is now down to 2.42 from 2.84 on admission. Patient has been eating and drinking fluids.   - Additional albumin 87.5g today - LR @ 75 mL/hr - f/u bladder scan  -Trend BMP - hold lasix and spironolactone  - strict I/O  - daily weights - hold  nephrotoxic medications    History of Diabetes Most recent A1c 5.6 with patient not taking previous diabetes medications, these were stopped at previous discharge.  -SSI     Anemia likely 2/2 Chronic Disease Likely anemia of chronic disease. He does have hx of GI bleed but no symptoms at this time and hemoglobin is stable and at baseline. Normocytic. Ferritin Normal (119), TIBC low (151).  -continued monitoring     Hypertension BP soft. Holding home medications     Gout allopurinol every other day for now     LOS: 3 days   Azell Der, Medical Student 09/21/2020, 3:07 PM

## 2020-09-21 NOTE — Progress Notes (Signed)
   RE:  Brad [name]       Date of Birth: 06-14-2051      Date:   09/21/20       To Whom It May Concern:  Please be advised that the above-named patient will require a short-term nursing home stay - anticipated 30 days or less for rehabilitation and strengthening.  The plan is for return home.                 MD signature                Date

## 2020-09-21 NOTE — Progress Notes (Signed)
Patient is very verbal about not getting a new IV site and fluids after he removed it. He also said repeatedly that MD told him he is going home today and then was sneaky and said behind his back he wasn't going home. He refused to wear arm band because he is going home. He is very upset tonight and was given PRN Medication for anxiety. Will continue to monitor Arthor Captain LPN

## 2020-09-22 ENCOUNTER — Other Ambulatory Visit: Payer: Self-pay | Admitting: Student

## 2020-09-22 MED ORDER — GABAPENTIN 600 MG PO TABS
600.0000 mg | ORAL_TABLET | Freq: Two times a day (BID) | ORAL | 0 refills | Status: AC
Start: 2020-09-22 — End: ?

## 2020-09-22 MED ORDER — RIFAXIMIN 550 MG PO TABS
550.0000 mg | ORAL_TABLET | Freq: Two times a day (BID) | ORAL | 0 refills | Status: AC
Start: 2020-09-22 — End: ?

## 2020-09-22 MED ORDER — SPIRONOLACTONE 100 MG PO TABS: 100.0000 mg | ORAL_TABLET | Freq: Two times a day (BID) | ORAL | 0 refills | Status: AC

## 2020-09-22 MED ORDER — FISH OIL 1000 MG PO CAPS: 1000.0000 mg | ORAL_CAPSULE | Freq: Every day | ORAL | 0 refills | Status: AC

## 2020-09-22 MED ORDER — FUROSEMIDE 40 MG PO TABS: 80.0000 mg | ORAL_TABLET | Freq: Every day | ORAL | 0 refills | Status: AC

## 2020-09-22 MED ORDER — ALLOPURINOL 100 MG PO TABS: 100.0000 mg | ORAL_TABLET | Freq: Every day | ORAL | 0 refills | Status: AC

## 2020-09-22 MED ORDER — VITAMIN D3 25 MCG (1000 UNIT) PO TABS: 1000.0000 [IU] | ORAL_TABLET | Freq: Every day | ORAL | 0 refills | Status: AC

## 2020-09-22 MED ORDER — EMPAGLIFLOZIN 10 MG PO TABS: 10.0000 mg | ORAL_TABLET | Freq: Every day | ORAL | 0 refills | Status: AC

## 2020-09-22 MED ORDER — PANTOPRAZOLE SODIUM 40 MG PO TBEC: 40.0000 mg | DELAYED_RELEASE_TABLET | Freq: Every day | ORAL | 0 refills | Status: AC

## 2020-09-22 MED ORDER — GLIPIZIDE 5 MG PO TABS
10.0000 mg | ORAL_TABLET | Freq: Every day | ORAL | 0 refills | Status: AC
Start: 2020-09-22 — End: ?

## 2020-09-22 MED ORDER — CETIRIZINE HCL 10 MG PO TABS: 20.0000 mg | ORAL_TABLET | Freq: Every day | ORAL | 0 refills | Status: AC

## 2020-09-22 MED ORDER — ESCITALOPRAM OXALATE 10 MG PO TABS: 10.0000 mg | ORAL_TABLET | Freq: Every day | ORAL | 0 refills | Status: AC

## 2020-09-22 MED ORDER — HYDROXYZINE HCL 25 MG PO TABS
25.0000 mg | ORAL_TABLET | Freq: Every evening | ORAL | 0 refills | Status: AC | PRN
Start: 2020-09-22 — End: ?

## 2020-09-22 MED ORDER — LORATADINE 10 MG PO TABS: 10.0000 mg | ORAL_TABLET | Freq: Every day | ORAL | 0 refills | Status: DC

## 2020-09-22 MED ORDER — LACTULOSE 10 GM/15ML PO SOLN: 70.0000 g | ORAL | 0 refills | Status: AC

## 2020-09-22 NOTE — Discharge Summary (Addendum)
PATIENT LEFT AMA    Name: Brad [name] MRN: [id number] DOB: 16-Mar-1951 70 y.o. PCP: Glendon Axe, MD  Date of Admission: 09/17/2020 10:08 PM Date of Discharge: 09/21/2020  Attending Physician: Lenice Pressman  Discharge Diagnosis: Hepatic encephalopathy AKI on CKD, dehydration vs hepatorenal syndrome Anemia of chronic disease  Discharge Medications: Allergies as of 09/21/2020       Reactions   Lisinopril Swelling   Penicillins Swelling   Has patient had a PCN reaction causing immediate rash, facial/tongue/throat swelling, SOB or lightheadedness with hypotension: No Has patient had a PCN reaction causing severe rash involving mucus membranes or skin necrosis: No Has patient had a PCN reaction that required hospitalization: Unknown Has patient had a PCN reaction occurring within the last 10 years: No If all of the above answers are "NO", then may proceed with Cephalosporin use.        Medication List     ASK your doctor about these medications    allopurinol 100 MG tablet Commonly known as: ZYLOPRIM Take 100 mg by mouth 2 (two) times daily.   CALCIUM PO Take 1 tablet by mouth at bedtime.   cetirizine 10 MG tablet Commonly known as: ZYRTEC Take 20 mg by mouth daily.   empagliflozin 10 MG Tabs tablet Commonly known as: JARDIANCE Take 10 mg by mouth daily.   escitalopram 10 MG tablet Commonly known as: LEXAPRO Take 10 mg by mouth daily.   fish oil-omega-3 fatty acids 1000 MG capsule Take 1 g by mouth at bedtime.   furosemide 40 MG tablet Commonly known as: LASIX Take 80 mg by mouth daily.   gabapentin 600 MG tablet Commonly known as: NEURONTIN Take 600 mg by mouth 2 (two) times daily.   glipiZIDE 5 MG tablet Commonly known as: GLUCOTROL Take 10 mg by mouth daily before breakfast.   hydrOXYzine 25 MG tablet Commonly known as: ATARAX/VISTARIL Take 25 mg by mouth in the morning, at noon, in the evening, and at bedtime.   lactulose 10 GM/15ML  solution Commonly known as: CHRONULAC Take 70 g by mouth See admin instructions. Mix 7 tbsp (70 g) in 1 pack cherry koolaid and 1 tbsp sugar/ divide into 3 doses and drink 3 times daily   loratadine 10 MG tablet Commonly known as: CLARITIN Take 10 mg by mouth at bedtime.   NovoLOG FlexPen 100 UNIT/ML FlexPen Generic drug: insulin aspart Inject into the skin 3 (three) times daily with meals. Per sliding scale   ondansetron 4 MG tablet Commonly known as: ZOFRAN Take 4 mg by mouth every 8 (eight) hours as needed for nausea or vomiting.   Oxycodone HCl 10 MG Tabs Take 10 mg by mouth 3 (three) times daily as needed for pain.   Ozempic (0.25 or 0.5 MG/DOSE) 2 MG/1.5ML Sopn Generic drug: Semaglutide(0.25 or 0.5MG/DOS) Inject 0.25 mg into the skin once a week.   pantoprazole 40 MG tablet Commonly known as: PROTONIX Take 40 mg by mouth daily.   Praluent 75 MG/ML Soaj Generic drug: Alirocumab Inject 75 mLs into the skin every 14 (fourteen) days.   rifaximin 550 MG Tabs tablet Commonly known as: XIFAXAN Take 550 mg by mouth 2 (two) times daily.   solifenacin 5 MG tablet Commonly known as: VESICARE Take 5 mg by mouth daily.   spironolactone 100 MG tablet Commonly known as: ALDACTONE Take 100 mg by mouth 2 (two) times daily.   triamcinolone 0.1 % Commonly known as: KENALOG Apply 1 application topically daily as needed (itching). Ask  about: Which instructions should I use?   VITAMIN D3 PO Take 1 tablet by mouth at bedtime.        Disposition and follow-up:   [name] left AMA from [facility] in [place] condition.  At the hospital follow up visit please address:  1.  Follow up: AKI - patient left before workup and treatment was completed, check creatinine, may need to repeat FENa to determine etiology. Called patient's brother with instructions to hold lasix and spironolactone for 1 week to allow recovery Hepatic encephalopathy 2/2 NASH cirrhosis -  patient with many admissions or hepatic encephalopathy due to under utilizing his lactulose. Attempting to set up home health and a new PCP for him. Increased laculose to QID, may need to further titrate lactulose dose. Had paracentesis this admission with benign fluid studies, repeat paracentesis as needed  2.  Labs / imaging needed at time of follow-up: BMP, urine creainine and sodium, paracentesis  3.  Pending labs/ test needing follow-up: none  Follow-up Appointments:    Hospital Course by problem list:  AKI on CKD 3a Presented with creatinine 2.87, baseline is around 1.1. Renal ultrasound was benign and FENa consistent with pre-renal cause, so could have been dehydration or pre-renal. Did not improve in first day with hydration but improved subsequently with albumin. Creatinine trended down, but bumped up again. Ordered repeat FENa to re-examine etiology, but patient left AMA prior to these being collected. Also had concern for post-renal cause given history of retention, but bladder scan volume was low. Held lasix and torsemide for 1 week on discharge to allow recovery.  Hepatic encephalopathy 2/2 NASH cirrhosis Resolved with lactulose and rifaximin, which are home medications. Increased lactulose dose to QID. Patient has had several admission for this problem, and has issues taking this at home. His brother helps arrange his pill box so he does okay with those, but seems to have difficulty taking adequate lactulose. Arranged HH at last admission, but they were only able to visit once because they were unable to contact his PCP for continuation orders. Informed after he left AMA that his PCP is no longer seeing him, so I will attempt to arrange a new PCP.  Anemia of chronic disease New diagnosis this admission. Normal iron and ferritin. Low TIBC and elevated saturation. No indication for iron repletion at this time.  Discharge Vitals:   BP 112/66 (BP Location: Right Arm)   Pulse 68    Temp 97.9 F (36.6 C) (Oral)   Resp 19   Ht 5' 10"  (1.778 m)   Wt 89.4 kg   SpO2 98%   BMI 28.28 kg/m   Physical Exam  Pertinent Labs, Studies, and Procedures:  US RENAL  Result Date: 09/18/2020 CLINICAL DATA:  Bladder obstruction EXAM: RENAL / URINARY TRACT ULTRASOUND COMPLETE COMPARISON:  Renal ultrasound August 22, 2020 FINDINGS: Right Kidney: Renal measurements: 11.2 x 4.8 x 5.9 cm = volume: 163.5 mL. Echogenicity within normal limits. No mass or hydronephrosis visualized. Left Kidney: Renal measurements: 11.8 x 4.8 x 6.7 cm = volume: 200.1 mL. Echogenicity within normal limits. No mass or hydronephrosis visualized. Bladder: Not visualized. Other: There is moderate to large volume ascites in the abdomen and pelvis. IMPRESSION: Normal kidneys.  Bladder is not visualized. Moderate to large volume ascites in the abdomen and pelvis. Electronically Signed   By: Abelardo Diesel M.D.   On: 09/18/2020 12:54    BMP Latest Ref Rng & Units 09/21/2020 09/21/2020 09/20/2020  Glucose  70 - 99 mg/dL 214(H) 173(H) 148(H)  BUN 8 - 23 mg/dL 56(H) 58(H) 54(H)  Creatinine 0.61 - 1.24 mg/dL 2.60(H) 2.42(H) 2.53(H)  BUN/Creat Ratio 10 - 24 - - -  Sodium 135 - 145 mmol/L 137 135 138  Potassium 3.5 - 5.1 mmol/L 3.7 3.7 3.7  Chloride 98 - 111 mmol/L 106 106 107  CO2 22 - 32 mmol/L 21(L) 21(L) 22  Calcium 8.9 - 10.3 mg/dL 9.1 8.8(L) 8.7(L)    Paracentesis fluid Gram stain: WBC present, no organisms Culture: negative Total nucleated cells: 67 Color: yellow  Urine sodium: <10 Urine creatinine: 244     Signed: Andrew Au, MD 09/22/2020, 7:55 AM   Pager: [phone]

## 2020-09-22 NOTE — Progress Notes (Addendum)
09/22/20 0835: CSW informed that pt left AMA last night.  CSW called pt on phone.  First phone call pt hung up on CSW.  CSW called back, offered HH, pt said, "I have stuff to do.I'm all right, just leave me alone."  MD had messaged about follow up services and he was updated. Lurline Idol, MSW, LCSW 1/27/20228:38 AM   09/22/20: 0845: CSW spoke with [place] at [place].  [facility] was set up at last discharge.  They did see pt but when they called his PCP for orders they were informed that pt was no longer active at that office.  They could not continue.  They cannot take pt back without a PCP.  MD informed. Lurline Idol, MSW, LCSW 1/27/20228:48 AM   09/22/20: 1450: At MD request, CSW reached out to [facility] to make referral.  They cannot work with resident of [facility] but there is [facility] in [place]: [place]: [phone].  CSW LM in the morning with Curt Bears in admissions.  CSW called back [phone] and was told Curt Bears still not available. Lurline Idol, MSW, LCSW 1/27/20222:51 PM

## 2020-09-22 NOTE — Progress Notes (Unsigned)
Encounter opened to provide prescription of home meds after patient left AMA.  I attempted to arrange Home Health for him with the gracious help of Social Work, but was informed that they would be unable to see him because they called his PCP, and they no longer are seeing him. This was unknown to the patient and to [place]. I will try to have him referred to another PCP such as [facility].

## 2020-09-23 LAB — CULTURE, BLOOD (ROUTINE X 2)
Culture: NO GROWTH
Culture: NO GROWTH
Special Requests: ADEQUATE

## 2020-09-24 LAB — CULTURE, BODY FLUID W GRAM STAIN -BOTTLE: Culture: NO GROWTH

## 2020-09-27 ENCOUNTER — Inpatient Hospital Stay (HOSPITAL_COMMUNITY)
Admission: EM | Admit: 2020-09-27 | Discharge: 2020-10-25 | DRG: 871 | Disposition: E | Payer: Medicare HMO | Attending: Pulmonary Disease | Admitting: Pulmonary Disease

## 2020-09-27 ENCOUNTER — Emergency Department (HOSPITAL_COMMUNITY): Payer: Medicare HMO

## 2020-09-27 ENCOUNTER — Encounter (HOSPITAL_COMMUNITY): Payer: Self-pay

## 2020-09-27 DIAGNOSIS — N390 Urinary tract infection, site not specified: Secondary | ICD-10-CM | POA: Diagnosis present

## 2020-09-27 DIAGNOSIS — Z515 Encounter for palliative care: Secondary | ICD-10-CM | POA: Diagnosis not present

## 2020-09-27 DIAGNOSIS — K767 Hepatorenal syndrome: Secondary | ICD-10-CM | POA: Diagnosis present

## 2020-09-27 DIAGNOSIS — N179 Acute kidney failure, unspecified: Secondary | ICD-10-CM

## 2020-09-27 DIAGNOSIS — E440 Moderate protein-calorie malnutrition: Secondary | ICD-10-CM | POA: Diagnosis present

## 2020-09-27 DIAGNOSIS — A419 Sepsis, unspecified organism: Principal | ICD-10-CM | POA: Diagnosis present

## 2020-09-27 DIAGNOSIS — R6521 Severe sepsis with septic shock: Secondary | ICD-10-CM | POA: Diagnosis not present

## 2020-09-27 DIAGNOSIS — K7290 Hepatic failure, unspecified without coma: Secondary | ICD-10-CM | POA: Diagnosis present

## 2020-09-27 DIAGNOSIS — Z6828 Body mass index (BMI) 28.0-28.9, adult: Secondary | ICD-10-CM

## 2020-09-27 DIAGNOSIS — K7581 Nonalcoholic steatohepatitis (NASH): Secondary | ICD-10-CM | POA: Diagnosis present

## 2020-09-27 DIAGNOSIS — E872 Acidosis: Secondary | ICD-10-CM | POA: Diagnosis present

## 2020-09-27 DIAGNOSIS — J189 Pneumonia, unspecified organism: Secondary | ICD-10-CM | POA: Diagnosis present

## 2020-09-27 DIAGNOSIS — G8929 Other chronic pain: Secondary | ICD-10-CM | POA: Diagnosis present

## 2020-09-27 DIAGNOSIS — J9601 Acute respiratory failure with hypoxia: Secondary | ICD-10-CM | POA: Diagnosis present

## 2020-09-27 DIAGNOSIS — N189 Chronic kidney disease, unspecified: Secondary | ICD-10-CM | POA: Diagnosis present

## 2020-09-27 DIAGNOSIS — K7682 Hepatic encephalopathy: Secondary | ICD-10-CM

## 2020-09-27 DIAGNOSIS — Y95 Nosocomial condition: Secondary | ICD-10-CM | POA: Diagnosis present

## 2020-09-27 DIAGNOSIS — R1310 Dysphagia, unspecified: Secondary | ICD-10-CM | POA: Diagnosis present

## 2020-09-27 DIAGNOSIS — Z9114 Patient's other noncompliance with medication regimen: Secondary | ICD-10-CM

## 2020-09-27 DIAGNOSIS — I1310 Hypertensive heart and chronic kidney disease without heart failure, with stage 1 through stage 4 chronic kidney disease, or unspecified chronic kidney disease: Secondary | ICD-10-CM | POA: Diagnosis present

## 2020-09-27 DIAGNOSIS — K7460 Unspecified cirrhosis of liver: Secondary | ICD-10-CM | POA: Diagnosis present

## 2020-09-27 DIAGNOSIS — E871 Hypo-osmolality and hyponatremia: Secondary | ICD-10-CM | POA: Diagnosis present

## 2020-09-27 DIAGNOSIS — R278 Other lack of coordination: Secondary | ICD-10-CM | POA: Diagnosis present

## 2020-09-27 DIAGNOSIS — R001 Bradycardia, unspecified: Secondary | ICD-10-CM | POA: Diagnosis present

## 2020-09-27 DIAGNOSIS — M109 Gout, unspecified: Secondary | ICD-10-CM | POA: Diagnosis present

## 2020-09-27 DIAGNOSIS — N170 Acute kidney failure with tubular necrosis: Secondary | ICD-10-CM | POA: Diagnosis present

## 2020-09-27 DIAGNOSIS — R4182 Altered mental status, unspecified: Secondary | ICD-10-CM | POA: Diagnosis not present

## 2020-09-27 DIAGNOSIS — F17210 Nicotine dependence, cigarettes, uncomplicated: Secondary | ICD-10-CM | POA: Diagnosis present

## 2020-09-27 DIAGNOSIS — R161 Splenomegaly, not elsewhere classified: Secondary | ICD-10-CM | POA: Diagnosis present

## 2020-09-27 DIAGNOSIS — K766 Portal hypertension: Secondary | ICD-10-CM | POA: Diagnosis present

## 2020-09-27 DIAGNOSIS — R188 Other ascites: Secondary | ICD-10-CM | POA: Diagnosis present

## 2020-09-27 DIAGNOSIS — Z20822 Contact with and (suspected) exposure to covid-19: Secondary | ICD-10-CM | POA: Diagnosis present

## 2020-09-27 DIAGNOSIS — N182 Chronic kidney disease, stage 2 (mild): Secondary | ICD-10-CM | POA: Diagnosis present

## 2020-09-27 DIAGNOSIS — D689 Coagulation defect, unspecified: Secondary | ICD-10-CM | POA: Diagnosis present

## 2020-09-27 DIAGNOSIS — E669 Obesity, unspecified: Secondary | ICD-10-CM | POA: Diagnosis present

## 2020-09-27 DIAGNOSIS — K219 Gastro-esophageal reflux disease without esophagitis: Secondary | ICD-10-CM | POA: Diagnosis present

## 2020-09-27 DIAGNOSIS — D631 Anemia in chronic kidney disease: Secondary | ICD-10-CM | POA: Diagnosis present

## 2020-09-27 DIAGNOSIS — G4730 Sleep apnea, unspecified: Secondary | ICD-10-CM | POA: Diagnosis present

## 2020-09-27 DIAGNOSIS — Z66 Do not resuscitate: Secondary | ICD-10-CM | POA: Diagnosis not present

## 2020-09-27 DIAGNOSIS — E1122 Type 2 diabetes mellitus with diabetic chronic kidney disease: Secondary | ICD-10-CM | POA: Diagnosis present

## 2020-09-27 DIAGNOSIS — K7200 Acute and subacute hepatic failure without coma: Secondary | ICD-10-CM | POA: Diagnosis present

## 2020-09-27 DIAGNOSIS — D696 Thrombocytopenia, unspecified: Secondary | ICD-10-CM | POA: Diagnosis present

## 2020-09-27 DIAGNOSIS — N289 Disorder of kidney and ureter, unspecified: Secondary | ICD-10-CM | POA: Diagnosis not present

## 2020-09-27 DIAGNOSIS — I469 Cardiac arrest, cause unspecified: Secondary | ICD-10-CM | POA: Diagnosis not present

## 2020-09-27 DIAGNOSIS — I4949 Other premature depolarization: Secondary | ICD-10-CM | POA: Diagnosis present

## 2020-09-27 DIAGNOSIS — E785 Hyperlipidemia, unspecified: Secondary | ICD-10-CM | POA: Diagnosis present

## 2020-09-27 DIAGNOSIS — E119 Type 2 diabetes mellitus without complications: Secondary | ICD-10-CM | POA: Diagnosis not present

## 2020-09-27 DIAGNOSIS — Z9049 Acquired absence of other specified parts of digestive tract: Secondary | ICD-10-CM

## 2020-09-27 DIAGNOSIS — Z79899 Other long term (current) drug therapy: Secondary | ICD-10-CM

## 2020-09-27 DIAGNOSIS — N185 Chronic kidney disease, stage 5: Secondary | ICD-10-CM | POA: Diagnosis not present

## 2020-09-27 DIAGNOSIS — M5136 Other intervertebral disc degeneration, lumbar region: Secondary | ICD-10-CM | POA: Diagnosis present

## 2020-09-27 DIAGNOSIS — Z7984 Long term (current) use of oral hypoglycemic drugs: Secondary | ICD-10-CM

## 2020-09-27 DIAGNOSIS — I129 Hypertensive chronic kidney disease with stage 1 through stage 4 chronic kidney disease, or unspecified chronic kidney disease: Secondary | ICD-10-CM | POA: Diagnosis present

## 2020-09-27 DIAGNOSIS — K7210 Chronic hepatic failure without coma: Secondary | ICD-10-CM | POA: Diagnosis present

## 2020-09-27 LAB — APTT: aPTT: 38 seconds — ABNORMAL HIGH (ref 24–36)

## 2020-09-27 LAB — AMMONIA: Ammonia: 73 umol/L — ABNORMAL HIGH (ref 9–35)

## 2020-09-27 LAB — I-STAT CHEM 8, ED
BUN: 78 mg/dL — ABNORMAL HIGH (ref 8–23)
Calcium, Ion: 1.11 mmol/L — ABNORMAL LOW (ref 1.15–1.40)
Chloride: 101 mmol/L (ref 98–111)
Creatinine, Ser: 7.9 mg/dL — ABNORMAL HIGH (ref 0.61–1.24)
Glucose, Bld: 156 mg/dL — ABNORMAL HIGH (ref 70–99)
HCT: 25 % — ABNORMAL LOW (ref 39.0–52.0)
Hemoglobin: 8.5 g/dL — ABNORMAL LOW (ref 13.0–17.0)
Potassium: 3.7 mmol/L (ref 3.5–5.1)
Sodium: 131 mmol/L — ABNORMAL LOW (ref 135–145)
TCO2: 18 mmol/L — ABNORMAL LOW (ref 22–32)

## 2020-09-27 LAB — COMPREHENSIVE METABOLIC PANEL
ALT: 27 U/L (ref 0–44)
AST: 35 U/L (ref 15–41)
Albumin: 3.3 g/dL — ABNORMAL LOW (ref 3.5–5.0)
Alkaline Phosphatase: 72 U/L (ref 38–126)
Anion gap: 13 (ref 5–15)
BUN: 76 mg/dL — ABNORMAL HIGH (ref 8–23)
CO2: 16 mmol/L — ABNORMAL LOW (ref 22–32)
Calcium: 8.3 mg/dL — ABNORMAL LOW (ref 8.9–10.3)
Chloride: 102 mmol/L (ref 98–111)
Creatinine, Ser: 7.59 mg/dL — ABNORMAL HIGH (ref 0.61–1.24)
GFR, Estimated: 7 mL/min — ABNORMAL LOW (ref >60)
Glucose, Bld: 164 mg/dL — ABNORMAL HIGH (ref 70–99)
Potassium: 3.6 mmol/L (ref 3.5–5.1)
Sodium: 131 mmol/L — ABNORMAL LOW (ref 135–145)
Total Bilirubin: 1.9 mg/dL — ABNORMAL HIGH (ref 0.3–1.2)
Total Protein: 5.9 g/dL — ABNORMAL LOW (ref 6.5–8.1)

## 2020-09-27 LAB — URINALYSIS, MICROSCOPIC (REFLEX)

## 2020-09-27 LAB — SARS CORONAVIRUS 2 BY RT PCR (HOSPITAL ORDER, PERFORMED IN ~~LOC~~ HOSPITAL LAB): SARS Coronavirus 2: NEGATIVE

## 2020-09-27 LAB — CBC WITH DIFFERENTIAL/PLATELET
Abs Immature Granulocytes: 0.04 10*3/uL (ref 0.00–0.07)
Basophils Absolute: 0 10*3/uL (ref 0.0–0.1)
Basophils Relative: 1 %
Eosinophils Absolute: 0.2 10*3/uL (ref 0.0–0.5)
Eosinophils Relative: 4 %
HCT: 25.5 % — ABNORMAL LOW (ref 39.0–52.0)
Hemoglobin: 8.5 g/dL — ABNORMAL LOW (ref 13.0–17.0)
Immature Granulocytes: 1 %
Lymphocytes Relative: 17 %
Lymphs Abs: 1.1 10*3/uL (ref 0.7–4.0)
MCH: 32.1 pg (ref 26.0–34.0)
MCHC: 33.3 g/dL (ref 30.0–36.0)
MCV: 96.2 fL (ref 80.0–100.0)
Monocytes Absolute: 0.7 10*3/uL (ref 0.1–1.0)
Monocytes Relative: 11 %
Neutro Abs: 4.4 10*3/uL (ref 1.7–7.7)
Neutrophils Relative %: 66 %
Platelets: 63 10*3/uL — ABNORMAL LOW (ref 150–400)
RBC: 2.65 MIL/uL — ABNORMAL LOW (ref 4.22–5.81)
RDW: 17.2 % — ABNORMAL HIGH (ref 11.5–15.5)
WBC: 6.5 10*3/uL (ref 4.0–10.5)
nRBC: 0 % (ref 0.0–0.2)

## 2020-09-27 LAB — CK: Total CK: 81 U/L (ref 49–397)

## 2020-09-27 LAB — URINALYSIS, ROUTINE W REFLEX MICROSCOPIC
Glucose, UA: 100 mg/dL — AB
Hgb urine dipstick: NEGATIVE
Ketones, ur: NEGATIVE mg/dL
Nitrite: NEGATIVE
Protein, ur: 100 mg/dL — AB
Specific Gravity, Urine: >1.03 — ABNORMAL HIGH (ref 1.005–1.030)
pH: 5 (ref 5.0–8.0)

## 2020-09-27 LAB — PROTIME-INR
INR: 1.6 — ABNORMAL HIGH (ref 0.8–1.2)
Prothrombin Time: 18.3 seconds — ABNORMAL HIGH (ref 11.4–15.2)

## 2020-09-27 LAB — LACTIC ACID, PLASMA: Lactic Acid, Venous: 1.5 mmol/L (ref 0.5–1.9)

## 2020-09-27 MED ORDER — SODIUM CHLORIDE 0.9 % IV SOLN
2.0000 g | Freq: Two times a day (BID) | INTRAVENOUS | Status: DC
  Administered 2020-09-27: 2 g via INTRAVENOUS
  Filled 2020-09-27: qty 2

## 2020-09-27 MED ORDER — VANCOMYCIN HCL 2000 MG/400ML IV SOLN
2000.0000 mg | Freq: Once | INTRAVENOUS | Status: AC
  Administered 2020-09-27: 2000 mg via INTRAVENOUS
  Filled 2020-09-27: qty 400

## 2020-09-27 MED ORDER — VANCOMYCIN VARIABLE DOSE PER UNSTABLE RENAL FUNCTION (PHARMACIST DOSING): Status: DC

## 2020-09-27 MED ORDER — SODIUM CHLORIDE 0.9 % IV SOLN
1.0000 g | INTRAVENOUS | Status: DC
  Administered 2020-09-28: 1 g via INTRAVENOUS
  Filled 2020-09-27 (×2): qty 1

## 2020-09-27 MED ORDER — SODIUM CHLORIDE 0.9 % IV SOLN: 2.0000 g | Freq: Once | INTRAVENOUS | Status: DC

## 2020-09-27 MED ORDER — METRONIDAZOLE IN NACL 5-0.79 MG/ML-% IV SOLN
500.0000 mg | Freq: Once | INTRAVENOUS | Status: AC
  Administered 2020-09-27: 500 mg via INTRAVENOUS
  Filled 2020-09-27: qty 100

## 2020-09-27 MED ORDER — VANCOMYCIN HCL IN DEXTROSE 1-5 GM/200ML-% IV SOLN: 1000.0000 mg | Freq: Once | INTRAVENOUS | Status: DC

## 2020-09-27 MED ORDER — TRIAMCINOLONE ACETONIDE 0.1 % EX CREA
1.0000 "application " | TOPICAL_CREAM | Freq: Every day | CUTANEOUS | Status: DC | PRN
  Filled 2020-09-27: qty 15

## 2020-09-27 MED ORDER — RIFAXIMIN 550 MG PO TABS
550.0000 mg | ORAL_TABLET | Freq: Two times a day (BID) | ORAL | Status: DC
  Administered 2020-09-28 (×2): 550 mg via ORAL
  Filled 2020-09-27 (×4): qty 1

## 2020-09-27 MED ORDER — SODIUM CHLORIDE 0.9 % IV BOLUS (SEPSIS)
1000.0000 mL | Freq: Once | INTRAVENOUS | Status: AC
  Administered 2020-09-27: 1000 mL via INTRAVENOUS

## 2020-09-27 MED ORDER — ONDANSETRON HCL 4 MG PO TABS: 4.0000 mg | ORAL_TABLET | Freq: Three times a day (TID) | ORAL | Status: DC | PRN

## 2020-09-27 MED ORDER — LACTATED RINGERS IV SOLN: INTRAVENOUS | Status: DC

## 2020-09-27 MED ORDER — SODIUM CHLORIDE 0.9 % IV BOLUS
1000.0000 mL | Freq: Once | INTRAVENOUS | Status: AC
  Administered 2020-09-27: 1000 mL via INTRAVENOUS

## 2020-09-27 MED ORDER — VANCOMYCIN HCL 1750 MG/350ML IV SOLN: 1750.0000 mg | INTRAVENOUS | Status: DC

## 2020-09-27 MED ORDER — PANTOPRAZOLE SODIUM 40 MG PO TBEC
40.0000 mg | DELAYED_RELEASE_TABLET | Freq: Every day | ORAL | Status: DC
  Administered 2020-09-27 – 2020-09-28 (×2): 40 mg via ORAL
  Filled 2020-09-27 (×2): qty 1

## 2020-09-27 MED ORDER — ALBUMIN HUMAN 25 % IV SOLN
50.0000 g | Freq: Once | INTRAVENOUS | Status: DC
  Filled 2020-09-27: qty 200

## 2020-09-27 MED ORDER — LACTULOSE 10 GM/15ML PO SOLN
30.0000 g | Freq: Four times a day (QID) | ORAL | Status: DC
  Administered 2020-09-27 – 2020-09-28 (×4): 30 g via ORAL
  Filled 2020-09-27 (×5): qty 45

## 2020-09-27 MED ORDER — LACTULOSE 10 GM/15ML PO SOLN
30.0000 g | Freq: Once | ORAL | Status: AC
  Administered 2020-09-27: 30 g via ORAL
  Filled 2020-09-27: qty 45

## 2020-09-27 NOTE — H&P (Incomplete)
Date: 10/09/2020               Patient Name:  Brad [name] MRN: [id number]  DOB: 10/18/1950 Age / Sex: 70 y.o., male   PCP: Glendon Axe, MD         Medical Service: Internal Medicine Teaching Service         Attending Physician: Dr. Velna Ochs, MD    First Contact: Dr. Coy Saunas Pager: [phone]  Second Contact: Dr. Darrick Meigs Pager: [phone]       After Hours (After 5p/  First Contact Pager: [phone]  weekends / holidays): Second Contact Pager: [phone]   Chief Complaint: Altered Mental Status  History of Present Illness:   Mr. Peiffer is a 70 y.o. gentleman w/ PMHx cirrhosis 2/2 NASH, Type II DM, CKD stage ***, who presented to the ED today due to AMS s/p recent admission for the same 1/22-1/26/22. He states that he remembers trying to take a nap on the couch earlier today and was brought to the hospital by his brother and mother who he lives with at home. He thinks he was taken to the hospital for his blood sugar. States he has had worsening abdominal swelling over the past week and notes that he forgot to take his lactulose today. He states he usually has one bowel movement per day but did not have a bowel movement today. Is unable to list off his other medications. Denies fevers, chills, SOB, cough, light-headedness, dizziness, tremors, fatigue, abdominal pain, dark or bloody stools, nausea, vomiting, hematemesis, decreased urination or dysuria, flank pain. States that he has been eating and drinking well and has been able to carry out his usual daily activities.   ED Course:   Patient was initially found to be hypotensive, slightly bradycardic, and hypoxic. He was given 1L NS with improvement in blood pressure initially and required 6L [place], although was switched to NRB given significant mouth breathing. Afebrile, although started on broad spectrum ABX given CXR concerning for PNA and urine concerning for UTI. Foley was placed due to concern for urinary retention.   Home  Medsd: Allopurinol '100mg'$  daily Cetirizine '20mg'$  daily Cholecalciferol 1000 units daily Jardiance '10mg'$  daily Lexapro '10mg'$  daily Lasix '80mg'$  daily Gabapentin '600mg'$  twice daily Glipizide '10mg'$  daily Hydroxyzine '25mg'$  nightly PRN Lactulose 70g divided by 4 doses daily Claritin '10mg'$  nightly Fish Oil '1000mg'$  daily Zofran '4mg'$  TID PRN Protonix '40mg'$  daily Rifaximin '550mg'$  twice daily Spironolactone '100mg'$  twice daily Triamcinolone 0.1% cream daily PRN  Allergies: Allergies as of 10/02/2020 - Review Complete 10/24/2020  Allergen Reaction Noted  . Lisinopril Swelling 06/07/2015  . Penicillins Swelling 12/28/2012   Past Medical History:  Diagnosis Date  . Cirrhosis of liver not due to alcohol ([place])   . DDD (degenerative disc disease), lumbar   . Diabetes mellitus without complication ([place])   . Gout   . Hypertension     Family History:  ***  Social History:  Lives at home with his brother and mother.  Denies any current or former alcohol use.  Smokes cigarettes but is unable to quantify how much nor how long.  Denies any history of illicit drug use.   Review of Systems: A complete ROS was negative except as per HPI.   Physical Exam: Blood pressure (!) 114/56, pulse (!) 58, temperature 97.6 F (36.4 C), temperature source Rectal, resp. rate 18, height '5\' 10"'$  (1.778 m), weight 89.4 kg, SpO2 95 %.  General: Patient appears chronically ill and uncomfortable although in  no acute distress. Eyes: Sclera non-icteric. No conjunctival injection.  HENT: Neck is supple. Dry mucus membranes. No nasal discharge. Respiratory: There are diffuse rhonchi with some rales throughout all lung fields, significantly worse on the right than left. No wheezing. Patient is tachypneic with mild accessory muscle use on 6L NRB.  Cardiovascular: Rate is borderline bradycardic. Rhythm is regular. No murmurs, rubs, or gallops. Bilateral lower extremities are warm, without edema. Abdominal: There is significant  abdominal distention with positive fluid wave. Abdomen is slightly tense due to distention, although not tender to palpation. No rebound or guarding. Bowel sounds intact throughout. Neurological: Patient is oriented x 3, although has some dysphagia, with slowed mentation. Able to follow simple commands. Asterixis present in bilateral upper extremities.  Musculoskeletal: Mild cachexia present throughout all extremities. ROMI in all four extremities.  Skin: There are healing lesions consistent with excoriations on right shin. Significant purpura present on bilateral upper extremities.   Psych: Significant agitation and psychomotor activation.   EKG: personally reviewed my interpretation is NSR at 80bpm with non-specific interventricular conduction delay and single PVC. QTc slighly prolonged ~470.  CXR: personally reviewed my interpretation is R > L airspace opacification, concerning for pneumonia. Cardiomegaly with interstitial prominence concerning for edema.   CT Head w/o Contrast:  IMPRESSION: 1. No acute intracranial abnormality. 2. Stable atrophy and chronic small vessel ischemia. 3. Scattered opacification of left mastoid air cells, new from prior exam.  Electronically Signed   By: Keith Rake M.D.   On: 10/11/2020 19:49  Assessment & Plan by Problem: Active Problems:   Pneumonia  # Hepatic Encephalopathy  # Cirrhosis 2/2 NASH  Patient presents after recent admission 1/22-1/26/22. His hepatic encephalopathy was thought to be due to missed lactulose doses last admission and resolved with lactulose and rifaximin. His Lasix and Spironolactone have been held since his discharge due to AKI. He currently has asterixis, AMS concerning for encephalopathy and notes he has only had 1 bowel movement daily over the last week with none today, and missed his lactulose dosing today. Hx of poor medication compliance. Na 131, T. Bili 1.9, Albumin 3.3, INR 1.6. Child-Pugh Class B, MELD score >27.  No fevers or abdominal pain to suggest SBP. Follows with [facility] and has received multiple paracenteses monthly recently and   - Trend daily CMP's - Give 30g Lactulose four times daily for goal ~4 soft BM daily - Continue rifaximin '550mg'$  twice daily - Ordered IR paracentesis  - Continue to hold home Lasix '80mg'$  daily and Spironolactone '100mg'$  twice daily - Hold home centrally-acting medications including: gabapentin, hydroxyzine, Lexapro, cetirizine, Claritin - Hold Zofran and anti-nausea medications unless needed given prolonged QTc   # Acute Renal Failure  Creatinine 7.59, BUN 76, GFR 7, K+ 3.6. Last renal ultrasound 09/18/20 showed no medical renal disease, hydronephrosis, or mass. Likely due to hepatorenal syndrome and possible ATN due to third spacing of fluid and hypotension possibly due to sepsis. Patient appears dry on exam.   - Decrease LR to 150m/hr  - Strict I&O - Daily weights  - Continuous telemetry monitoring   # Acute Hypoxic Respiratory Failure # Sepsis likely 2/2 PNA  Patient noted to be hypoxic in the ED, requiring 6L [place] to maintain saturations in the 90's. NRB placed as patient is a mouth breather. Afebrile, denying SOB, cough or sick contacts, although has significant rhonchi on examination with CXR showing R > L airspace opacification concerning for PNA and also CM with likely interstitial edema, possibly in  setting of held Lasix and spironolactone. COVID-19 PCR negative. Lactic acid normal. Started on Cefepime, Vancomycin, and Metronidazole in the ED.    - Continuous pulse oximetry with supplemental O2 for SpO2 >92%  - Continue Cefepime 1g IV daily - Continue Vancomycin per pharmacy  - Blood culture x 2 pending   # Possible Urinary Tract Infection  U/A with trace leukocytes, negative nitrites, 6-10 WBC's with clumping and many bacteria, concerning for UTI. Bladder scan did not show retention, with 119cc's; however, foley catheter was placed in the ED  - urine  culture pending  - Continue foley catheter for now  # Normocytic Anemia  Hemoglobin 8.5. Denies any hematemesis, dark or bloody BM's, although hemoglobin has been down-trending from 14 in September. Had an EGD 2 months ago that was unremarkable.   - Continue to monitor morning CBC's  # Thrombocytopenia, Chronic  Plt 63, stable, in setting of cirrhosis.   - Continue to monitor CBC's and for signs of bleeding  # Non-Anion Gap Metabolic Acidosis Bicarb 16, Anion Gap 13. Likely due to acute renal failure. Denies diarrhea despite lactulose. Lactic acid WNL.  - Continue to monitor daily   # NIDDM Type II Takes glipizide and Jardiance '10mg'$  daily at home. Last hemoglobin A1c was 5.6 08/21/20.   - Continue to monitor for now - Will hold home medications given acute renal failure - Start SSI if hyperglycemia continues   Code Status: Full Code Diet: Renal with Fluid Restriction to 1.2L  IVF: LR @ 175m/hr DVT PPx: SCD's  Dispo: Admit patient to Observation with expected length of stay less than 2 midnights.  Signed: SJeralyn Bennett MD 10/20/2020, 10:07 PM  Pager: [phone] 5pm on weekdays and 1pm on weekends: On Call pager: [phone]

## 2020-09-27 NOTE — ED Provider Notes (Signed)
[place] EMERGENCY DEPARTMENT Provider Note   CSN: [id number] Arrival date & time: 10/02/2020  1755     History Chief Complaint  Patient presents with  . Code Sepsis    Brad [name] is a 70 y.o. male.  Brad [name] is a 70 y.o. 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  Patient is altered and not able to provide much history.  Per EMS they were called out to the house because family couldn't get the patient to answer the phone, brother went to the house and found the patient altered in bed, minimally responsive, brother could not seem to get the patient to wake up.  Patient reported to his mom yesterday that he wasn't feeling well, but seemed to be acting like his usual self without confusion.  Patient minimally responsive to voice when EMS arrived and found to be hypoxic at 80% on room air and febrile.  They reported blood pressures in the 90s on scene, patient placed on 4 L nasal cannula and given IV fluids in route.  Patient denies any focal pain, reports not feeling well and feeling tired but unable to provide additional meaningful history.  Patient was just recently discharged from the hospital on 1/26 after admission for hepatic encephalopathy, has had numerous admissions for the same and is known to be noncompliant with lactulose.  Unknown sick contacts.        Past Medical History:  Diagnosis Date  . Cirrhosis of liver not due to alcohol ([place])   . DDD (degenerative disc disease), lumbar   . Diabetes mellitus without complication ([place])   . Gout   . Hypertension     Patient Active Problem List   Diagnosis Date Noted  . Sepsis with acute hypoxic respiratory failure without septic shock ([place]) 10/21/2020  .  Acute-on-chronic kidney injury ([place]) 09/20/2020  . Urinary retention 09/20/2020  . Diabetes ([place]) 08/24/2020  . Hepatic encephalopathy ([place]) 08/21/2020  . Abnormal stress test 07/24/2017  . Anginal chest pain at rest [facility]) 07/24/2017  . Benign essential hypertension 11/09/2015  . Cirrhosis ([place]) 11/09/2015  . Hyperlipidemia 11/09/2015  . Insulin long-term use ([place]) 11/09/2015  . OSA (obstructive sleep apnea) 11/09/2015  . Uncontrolled type 2 diabetes mellitus without complication, with long-term current use of insulin 11/09/2015  . 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 Neck pain on left side 04/28/2013    Past Surgical History:  Procedure Laterality Date  . ADENOIDECTOMY    . ANKLE SURGERY    . CHOLECYSTECTOMY    . IR PARACENTESIS  08/22/2020  . IR PARACENTESIS  09/19/2020  . IR RADIOLOGIST EVAL & MGMT  02/10/2019  . LEFT HEART CATH AND CORONARY ANGIOGRAPHY N/A 07/29/2017   Procedure: LEFT HEART CATH AND CORONARY ANGIOGRAPHY;  Surgeon: Lorretta Harp, MD;  Location: [facility];  Service: Cardiovascular;  Laterality: N/A;  . TONSILLECTOMY         No family history on file.  Social History   Tobacco Use  . Smoking status: Former Research scientist (life sciences)  . Smokeless tobacco: Never Used  Substance Use Topics  . Alcohol use: No  . Drug use: No    Home Medications Prior to Admission medications   Medication Sig Start Date End Date Taking? Authorizing Provider  allopurinol (ZYLOPRIM) 100  MG tablet Take 1 tablet (100 mg total) by mouth daily. Hold for 1 week to allow kidney function to improve 09/22/20   Andrew Au, MD  CALCIUM PO Take 1 tablet by mouth at bedtime.    [provider]  cetirizine (ZYRTEC) 10 MG tablet Take 2 tablets (20 mg total) by mouth daily. 09/22/20   Andrew Au, MD  cholecalciferol (VITAMIN D) 25 MCG (1000 UNIT) tablet Take 1 tablet (1,000 Units total) by mouth daily. 09/22/20   Andrew Au, MD  empagliflozin (JARDIANCE) 10 MG TABS tablet Take 1 tablet  (10 mg total) by mouth daily. 09/22/20   Andrew Au, MD  escitalopram (LEXAPRO) 10 MG tablet Take 1 tablet (10 mg total) by mouth daily. 09/22/20   Andrew Au, MD  furosemide (LASIX) 40 MG tablet Take 2 tablets (80 mg total) by mouth daily. Hold for 1 week to allow kidney function to improve 09/22/20   Andrew Au, MD  gabapentin (NEURONTIN) 600 MG tablet Take 1 tablet (600 mg total) by mouth 2 (two) times daily. 09/22/20   Andrew Au, MD  glipiZIDE (GLUCOTROL) 5 MG tablet Take 2 tablets (10 mg total) by mouth daily before breakfast. 09/22/20   Andrew Au, MD  hydrOXYzine (ATARAX/VISTARIL) 25 MG tablet Take 1 tablet (25 mg total) by mouth at bedtime as needed. 09/22/20   Andrew Au, MD  lactulose (CHRONULAC) 10 GM/15ML solution Take 105 mLs (70 g total) by mouth See admin instructions. Mix 12 tbsp (120 g) in 1 pack cherry koolaid and 1 tbsp sugar/ divide into 4 doses and drink 4 times daily 09/22/20   Andrew Au, MD  loratadine (CLARITIN) 10 MG tablet Take 1 tablet (10 mg total) by mouth at bedtime. 09/22/20   Andrew Au, MD  Omega-3 Fatty Acids (FISH OIL) 1000 MG CAPS Take 1 capsule (1,000 mg total) by mouth daily. 09/22/20   Andrew Au, MD  ondansetron (ZOFRAN) 4 MG tablet Take 4 mg by mouth every 8 (eight) hours as needed for nausea or vomiting. 09/14/20   [provider]  Oxycodone HCl 10 MG TABS Take 10 mg by mouth 3 (three) times daily as needed for pain. 06/26/17   [provider]  pantoprazole (PROTONIX) 40 MG tablet Take 1 tablet (40 mg total) by mouth daily. 09/22/20   Andrew Au, MD  rifaximin (XIFAXAN) 550 MG TABS tablet Take 1 tablet (550 mg total) by mouth 2 (two) times daily. 09/22/20   Andrew Au, MD  solifenacin (VESICARE) 5 MG tablet Take 5 mg by mouth daily.    [provider]  spironolactone (ALDACTONE) 100 MG tablet Take 1 tablet (100 mg total) by mouth 2 (two) times daily. Hold for 1 week to allow kidney function to improve  09/22/20   Andrew Au, MD  triamcinolone (KENALOG) 0.1 % Apply 1 application topically daily as needed (itching). 05/19/20   [provider]    Allergies    Lisinopril and Penicillins  Review of Systems   Review of Systems  Unable to perform ROS: Mental status change    Physical Exam Updated Vital Signs BP (!) 90/49   Pulse 60   Temp 97.6 F (36.4 C) (Rectal)   Resp 12   Ht 5' 10"  (1.778 m)   Wt 89.4 kg   SpO2 97%   BMI 28.28 kg/m   Physical Exam Vitals and nursing note reviewed.  Constitutional:  Appearance: He is well-developed and well-nourished. He is obese. He is ill-appearing.     Comments: Ill-appearing male, appears older than stated age, altered but somewhat responsive  HENT:     Head: Normocephalic and atraumatic.     Mouth/Throat:     Mouth: Oropharynx is clear and moist. Mucous membranes are dry.  Eyes:     General:        Right eye: No discharge.        Left eye: No discharge.     Extraocular Movements: EOM normal.     Pupils: Pupils are equal, round, and reactive to light.  Cardiovascular:     Rate and Rhythm: Normal rate and regular rhythm.     Pulses: Intact distal pulses.     Heart sounds: Normal heart sounds.  Pulmonary:     Breath sounds: Wheezing and rhonchi present. No rales.     Comments: Patient is tachypneic with some increased respiratory effort, on 4 L nasal cannula with sats in the upper 80s to low 90s, increased to 6 L with improvement.  Altered, able to answer some questions in short sentences.  On auscultation patient with scattered rhonchi and wheezing in bilateral lung fields. Abdominal:     General: Bowel sounds are normal. There is distension.     Palpations: Abdomen is soft. There is no mass.     Tenderness: There is no abdominal tenderness. There is no guarding.     Comments: Abdomen is somewhat distended but soft without obvious fluid wave, bandage present over the left side of the abdomen likely from recent  paracentesis, no surrounding erythema, no focal tenderness or peritoneal signs.  Musculoskeletal:        General: No deformity or edema.     Cervical back: Neck supple.  Skin:    General: Skin is warm and dry.     Capillary Refill: Capillary refill takes less than 2 seconds.  Neurological:     Coordination: Coordination normal.     Comments: Exam limited due to mental status, patient altered, somewhat responsive and following some commands.   Moving all extremities independently  Psychiatric:     Comments: Unable to assess due to altered mental status     ED Results / Procedures / Treatments   Labs (all labs ordered are listed, but only abnormal results are displayed) Labs Reviewed  COMPREHENSIVE METABOLIC PANEL - Abnormal; Notable for the following components:      Result Value   Sodium 131 (*)    CO2 16 (*)    Glucose, Bld 164 (*)    BUN 76 (*)    Creatinine, Ser 7.59 (*)    Calcium 8.3 (*)    Total Protein 5.9 (*)    Albumin 3.3 (*)    Total Bilirubin 1.9 (*)    GFR, Estimated 7 (*)    All other components within normal limits  CBC WITH DIFFERENTIAL/PLATELET - Abnormal; Notable for the following components:   RBC 2.65 (*)    Hemoglobin 8.5 (*)    HCT 25.5 (*)    RDW 17.2 (*)    Platelets 63 (*)    All other components within normal limits  PROTIME-INR - Abnormal; Notable for the following components:   Prothrombin Time 18.3 (*)    INR 1.6 (*)    All other components within normal limits  APTT - Abnormal; Notable for the following components:   aPTT 38 (*)    All other components within normal limits  URINALYSIS, ROUTINE W REFLEX MICROSCOPIC - Abnormal; Notable for the following components:   Specific Gravity, Urine >1.030 (*)    Glucose, UA 100 (*)    Bilirubin Urine SMALL (*)    Protein, ur 100 (*)    Leukocytes,Ua TRACE (*)    All other components within normal limits  AMMONIA - Abnormal; Notable for the following components:   Ammonia 73 (*)    All other  components within normal limits  URINALYSIS, MICROSCOPIC (REFLEX) - Abnormal; Notable for the following components:   Bacteria, UA MANY (*)    All other components within normal limits  I-STAT CHEM 8, ED - Abnormal; Notable for the following components:   Sodium 131 (*)    BUN 78 (*)    Creatinine, Ser 7.90 (*)    Glucose, Bld 156 (*)    Calcium, Ion 1.11 (*)    TCO2 18 (*)    Hemoglobin 8.5 (*)    HCT 25.0 (*)    All other components within normal limits  SARS CORONAVIRUS 2 BY RT PCR (HOSPITAL ORDER, [facility])  CULTURE, BLOOD (ROUTINE X 2)  CULTURE, BLOOD (ROUTINE X 2)  URINE CULTURE  MRSA PCR SCREENING  LACTIC ACID, PLASMA  CK  HIV ANTIBODY (ROUTINE TESTING W REFLEX)  COMPREHENSIVE METABOLIC PANEL  CBC WITH DIFFERENTIAL/PLATELET  CBG MONITORING, ED     EKG EKG Interpretation  Date/Time:  Tuesday September 27 2020 17:56:20 EST Ventricular Rate:  80 PR Interval:    QRS Duration: 118 QT Interval:  597 QTC Calculation: 602 R Axis:   78 Text Interpretation: Sinus rhythm Ventricular premature complex Nonspecific intraventricular conduction delay Confirmed by Dene Gentry [phone]) on 10/24/2020 6:13:18 PM   Radiology CT Head Wo Contrast  Result Date: 10/09/2020 CLINICAL DATA:  Mental status change. EXAM: CT HEAD WITHOUT CONTRAST TECHNIQUE: Contiguous axial images were obtained from the base of the skull through the vertex without intravenous contrast. COMPARISON:  Head CT 08/21/2020 FINDINGS: Brain: Stable area of atrophy and chronic small vessel ischemia. No intracranial hemorrhage, mass effect, or midline shift. No hydrocephalus. The basilar cisterns are patent. No evidence of territorial infarct or acute ischemia. No extra-axial or intracranial fluid collection. Vascular: Atherosclerosis of skullbase vasculature without hyperdense vessel or abnormal calcification. Skull: No fracture or focal lesion. Sinuses/Orbits: Scattered opacification of left  mastoid air cells, new from prior exam. Occasional mucosal thickening of ethmoid air cells. No sinus fluid levels. Orbits are unremarkable. Other: Non fusion posterior arch of C1 is incidental. IMPRESSION: 1. No acute intracranial abnormality. 2. Stable atrophy and chronic small vessel ischemia. 3. Scattered opacification of left mastoid air cells, new from prior exam. Electronically Signed   By: Keith Rake M.D.   On: 10/20/2020 19:49   DG Chest Port 1 View  Result Date: 10/24/2020 CLINICAL DATA:  Altered mental status.  Questionable sepsis EXAM: PORTABLE CHEST 1 VIEW COMPARISON:  08/21/2020 FINDINGS: Two frontal portable radiographs. Midline trachea. Cardiomegaly accentuated by AP portable technique. Apparent right paratracheal soft tissue fullness is favored to be technique related. Pulmonary interstitial prominence and indistinctness. Right greater than left airspace disease with relative sparing of the left upper lung. IMPRESSION: Right greater than left airspace disease, highly suspicious for pneumonia. Cardiomegaly with underlying interstitial thickening and indistinctness, likely due to a component of congestive heart failure. Aortic Atherosclerosis (ICD10-I70.0). Electronically Signed   By: Abigail Miyamoto M.D.   On: 10/13/2020 19:01    Procedures .Critical Care Performed by: Jacqlyn Larsen,  PA-C Authorized by: Jacqlyn Larsen, PA-C   Critical care provider statement:    Critical care time (minutes):  45   Critical care time was exclusive of:  Separately billable procedures and treating other patients and teaching time   Critical care was necessary to treat or prevent imminent or life-threatening deterioration of the following conditions:  Sepsis   Critical care was time spent personally by me on the following activities:  Discussions with consultants, evaluation of patient's response to treatment, examination of patient, ordering and performing treatments and interventions, ordering and  review of laboratory studies, ordering and review of radiographic studies, pulse oximetry, re-evaluation of patient's condition, obtaining history from patient or surrogate and review of old charts   Care discussed with: admitting provider       Medications Ordered in ED Medications  lactated ringers infusion ( Intravenous New Bag/Given 10/07/2020 1828)  ceFEPIme (MAXIPIME) 1 g in sodium chloride 0.9 % 100 mL IVPB (has no administration in time range)  vancomycin variable dose per unstable renal function (pharmacist dosing) (has no administration in time range)  lactulose (CHRONULAC) 10 GM/15ML solution 30 g (30 g Oral Given 09/28/2020 2244)  pantoprazole (PROTONIX) EC tablet 40 mg (40 mg Oral Given 10/21/2020 2244)  triamcinolone (KENALOG) 0.1 % cream 1 application (has no administration in time range)  albumin human 25 % solution 50 g (has no administration in time range)  rifaximin (XIFAXAN) tablet 550 mg (has no administration in time range)  metroNIDAZOLE (FLAGYL) IVPB 500 mg (0 mg Intravenous Stopped 10/24/2020 2002)  sodium chloride 0.9 % bolus 1,000 mL (0 mLs Intravenous Stopped 10/24/2020 1903)  vancomycin (VANCOREADY) IVPB 2000 mg/400 mL (0 mg Intravenous Stopped 10/02/2020 2232)  sodium chloride 0.9 % bolus 1,000 mL (0 mLs Intravenous Stopped 10/18/2020 2240)  lactulose (CHRONULAC) 10 GM/15ML solution 30 g (30 g Oral Given 10/09/2020 2109)    ED Course  I have reviewed the triage vital signs and the nursing notes.  Pertinent labs & imaging results that were available during my care of the patient were reviewed by me and considered in my medical decision making (see chart for details).    MDM Rules/Calculators/A&P                         70 year old male arrives via EMS for AMS, found to be febrile to 101.3 on scene and hypoxic to 80% on room air, somewhat responsive to voice. Code sepsis initiated on arrival and broad spectrum antibiotics initiated to cover for potential respiratory source, as well as SBP,  given severe illness without clear source at this time.   Additional history obtained from EMS. Previous records obtained and reviewed via EMR  I ordered IV antibiotics and 1L fluid bolus for code sepsis, but will hold off on 30 cc/kg bolus to avoid further third spacing of fluid in setting of cirrhosis with ascites, pending initial lactic acid. Will closing monitor BP response to initial fluid bolus  Lab Tests:  I Ordered, reviewed, and interpreted labs, which included:  CBC: without leukocytosis, stable hgb CMP: significant for acute renal failure with Cr increased from 2.6 to 7.95 with BUN of 77 which could be contributung to AMS, normal K fortunately, LFTs at baseline Lactic: reassuring at 1.5, does ot suggest severe sepsis Ammonia: 73, hepatic encephalopathy likely contributing to AMS UA: signs of UT present, culture pending  Imaging Studies ordered:  I ordered imaging studies which included chest x-ray, I independently visualized  and interpreted imaging which showed Right greater than left airspace disease, highly suspicious for pneumonia. Cardiomegaly with underlying interstitial thickening and indistinctness, likely due to a component of congestive heart failure.  Pt has already been started on broad spectrum ABX to cover for PNA on CXR, this also explains patients acute hypoxic respiratory failure  ED Course:  Patient presents septic with multifactorial AMS, with fluids, ABX and oxygen patients mental status is improving, but he is still critically ill requiring admission  On reevaluation after initial fluid bolus patients BP is improving, does not appear to be sever sepsis or shock  Lactulose given for hyperammonemia now that pt more alert and tolerating PO  I consulted Internal Medicine Teaching Service and discussed lab and imaging findings, they will see and admit the patient.   Portions of this note were generated with Lobby[employer]. Dictation errors may occur  despite best attempts at proofreading.   Final Clinical Impression(s) / ED Diagnoses Final diagnoses:  Sepsis due to pneumonia ([place])  Hepatic encephalopathy ([place])  Cirrhosis, non-alcoholic ([place])  Acute renal failure, unspecified acute renal failure type [facility])    Rx / DC Orders ED Discharge Orders    None       Janet Berlin 09/28/20 2222    Valarie Merino, MD 09/28/20 2258

## 2020-09-27 NOTE — ED Notes (Signed)
Richardson Landry, Pts brother given an update with pts permission.

## 2020-09-27 NOTE — Progress Notes (Addendum)
Pharmacy Antibiotic Note  Brad [name] is a 70 y.o. male admitted on 10/06/2020 with sepsis.  Pharmacy has been consulted for Vancomycin and Cefepime dosing. Scr very elevated at 7.9, CrCl 9.9 ml/min. Baseline seems to be ~1.1. Afebrile. Patient already received Cefepime 2g today.  Plan: Cefepime 1g IV q24 hrs starting 09/28/20 Vancomycin 2000 mg IV once Given poor renal function, will follow Scr closely and enter maintenance vancomycin dose when indicated Monitor renal function, cultures/sensitivities, clinical progression Vancomycin levels as indicated   Height: '5\' 10"'$  (177.8 cm) Weight: 89.4 kg (197 lb 1.5 oz) IBW/kg (Calculated) : 73  Temp (24hrs), Avg:97.6 F (36.4 C), Min:97.6 F (36.4 C), Max:97.6 F (36.4 C)  Recent Labs  Lab 09/21/20 0224 09/21/20 1627  WBC 3.9*  --   CREATININE 2.42* 2.60*    Estimated Creatinine Clearance: 30.2 mL/min (A) (by C-G formula based on SCr of 2.6 mg/dL (H)).    Allergies  Allergen Reactions  . Lisinopril Swelling  . Penicillins Swelling    Has patient had a PCN reaction causing immediate rash, facial/tongue/throat swelling, SOB or lightheadedness with hypotension: No Has patient had a PCN reaction causing severe rash involving mucus membranes or skin necrosis: No Has patient had a PCN reaction that required hospitalization: Unknown Has patient had a PCN reaction occurring within the last 10 years: No If all of the above answers are "NO", then may proceed with Cephalosporin use.     Antimicrobials this admission: Vancomycin 2/1 >>  Cefepime 2/1 >>   Dose adjustments this admission: n/A  Microbiology results: 2/1 BCx: pending 2/1 UCx: pending    Richardine Service, PharmD, BCPS PGY2 Cardiology Pharmacy Resident Phone: [phone] 10/12/2020  6:12 PM  Please check [email or web address] for unit-specific pharmacy phone numbers.

## 2020-09-27 NOTE — ED Triage Notes (Signed)
Pt arrived via [facility] from home. Per EMS this was the second time they were called out today. Mother called earlier and said she couldn't get in touch w/pt. Brother went to pt's house after work and found pt AMS in bed. Per EMS pt's brother said mother told him pt stated yesterday that he didn't feel good. Pt responded to voice to EMS, but was confused. VSS.

## 2020-09-27 NOTE — H&P (Addendum)
Date: 10/23/2020               Patient Name:  Brad [name] MRN: [id number]  DOB: Apr 24, 1951 Age / Sex: 70 y.o., male   PCP: Glendon Axe, MD         Medical Service: Internal Medicine Teaching Service         Attending Physician: Dr. Velna Ochs, MD    First Contact: Dr. Coy Saunas Pager: [phone]  Second Contact: Dr. Darrick Meigs Pager: [phone]       After Hours (After 5p/  First Contact Pager: [phone]  weekends / holidays): Second Contact Pager: [phone]   Chief Complaint: Altered Mental Status  History of Present Illness:   Brad [name] is a 70 y.o. gentleman w/ PMHx cirrhosis 2/2 NASH diagnosed in 2015, esophageal variceal bleed s/p banding in 202 with EGD 10/2019 that was unremarkable, bleeding AVM's requiring cauterization 06/2020, GERD, HTN, HLD, and controlled Type II DM who presented to the ED today due to AMS s/p recent admission for the same 1/22-1/26/22. He states that he remembers trying to take a nap on the couch earlier today and was brought to the hospital by his brother and mother who he lives with at home. He thinks he was taken to the hospital for his blood sugar. States he has had worsening abdominal swelling over the past week and notes that he forgot to take his lactulose today. He states he usually has one bowel movement per day but did not have a bowel movement today. Is unable to list off his other medications. Denies fevers, chills, SOB, cough, light-headedness, dizziness, tremors, fatigue, abdominal pain, dark or bloody stools, nausea, vomiting, hematemesis, decreased urination or dysuria, flank pain. States that he has been eating and drinking well and has been able to carry out his usual daily activities.   ED Course:   Patient was initially found to be hypotensive, slightly bradycardic, and hypoxic. He was given 1L NS with improvement in blood pressure initially and required 6L [place], although was switched to NRB given significant mouth breathing. Afebrile,  although started on broad spectrum ABX given CXR concerning for PNA and urine concerning for UTI. Foley was placed due to concern for urinary retention.   Home Meds: Allopurinol 177m daily Cetirizine 246mdaily Cholecalciferol 1000 units daily Jardiance 1060maily Lexapro 35m35mily Lasix 80mg33mly Gabapentin 600mg 76me daily Glipizide 35mg d53m Hydroxyzine 25mg ni21my PRN Lactulose 70g divided by 4 doses daily Claritin 35mg nig17m Fish Oil 1000mg dail14mfran 4mg TID PR35mrotonix 40mg daily 52mximin 550mg twice d13m Spironolactone 100mg twice da73mTriamcinolone 0.1% cream daily PRN  Allergies: Allergies as of 10/22/2020 - Review Complete 10/16/2020  Allergen Reaction Noted  . Lisinopril Swelling 06/07/2015  . Penicillins Swelling 12/28/2012   Past Medical History:  Diagnosis Date  . Cirrhosis of liver not due to alcohol (HCC)   . DDD ([place] disease), lumbar   . Diabetes mellitus without complication (HCC)   . Gout [place] Hypertension     Family History:  Unable to obtain reliable family history.   Social History:  Lives at home with his brother and mother.  Denies any current or former alcohol use.  Smokes cigarettes but is unable to quantify how much nor how long.  Denies any history of illicit drug use.   Review of Systems: A complete ROS was negative except as per HPI.   History and ROS limited 2/2 altered mental status.   Physical Exam:  Blood pressure (!) 114/56, pulse (!) 58, temperature 97.6 F (36.4 C), temperature source Rectal, resp. rate 18, height 5' 10"  (1.778 m), weight 89.4 kg, SpO2 95 %.  General: Patient appears chronically ill and uncomfortable although in no acute distress. Eyes: Sclera non-icteric. No conjunctival injection.  HENT: Neck is supple. Dry mucus membranes. No nasal discharge. Respiratory: There are diffuse rhonchi with some rales throughout all lung fields, significantly worse on the right than left. No  wheezing. Patient is tachypneic with mild accessory muscle use on 6L NRB.  Cardiovascular: Rate is borderline bradycardic. Rhythm is regular. No murmurs, rubs, or gallops. Bilateral lower extremities are warm, without edema. Abdominal: There is significant abdominal distention with positive fluid wave. Abdomen is slightly tense due to distention, although not tender to palpation. No rebound or guarding. Bowel sounds intact throughout. Neurological: Patient is oriented x 3, although has some dysphagia, with slowed mentation. Able to follow simple commands. Asterixis present in bilateral upper extremities.  Musculoskeletal: Mild cachexia present throughout all extremities. ROMI in all four extremities.  Skin: There are healing lesions consistent with excoriations on right shin. Significant purpura present on bilateral upper extremities.   Psych: Significant agitation and psychomotor activation.   EKG: personally reviewed my interpretation is NSR at 80bpm with non-specific interventricular conduction delay and single PVC. QTc slighly prolonged ~470.  CXR: personally reviewed my interpretation is R > L airspace opacification, concerning for pneumonia. Cardiomegaly with interstitial prominence concerning for edema.   CT Head w/o Contrast:  IMPRESSION: 1. No acute intracranial abnormality. 2. Stable atrophy and chronic small vessel ischemia. 3. Scattered opacification of left mastoid air cells, new from prior exam.  Electronically Signed   By: Keith Rake M.D.   On: 10/14/2020 19:49  Assessment & Plan by Problem: Active Problems:   Pneumonia  # Hepatic Encephalopathy  # Cirrhosis 2/2 NASH  Patient presents after recent admission 1/22-1/26/22. His hepatic encephalopathy was thought to be due to missed lactulose doses last admission and resolved with lactulose and rifaximin. His Lasix and Spironolactone have been held since his discharge due to AKI. He currently has asterixis, AMS  concerning for encephalopathy and notes he has only had 1 bowel movement daily over the last week with none today, and missed his lactulose dosing today. Hx of poor medication compliance. Na 131, T. Bili 1.9, Albumin 3.3, INR 1.6. Child-Pugh Class B, MELD score >27. No fevers or abdominal pain to suggest SBP. Follows with [facility] and has received multiple paracenteses monthly recently, with recent elevated AFP.   - Trend daily CMP's - Give 30g Lactulose four times daily for goal ~4 soft BM daily - Continue rifaximin 559m twice daily - Ordered IR paracentesis  - Continue to hold home Lasix 828mdaily and Spironolactone 10021mwice daily - Hold home centrally-acting medications including: gabapentin, hydroxyzine, Lexapro, cetirizine, Claritin - Hold Zofran and anti-nausea medications unless needed given prolonged QTc   # Acute Renal Failure  Creatinine 7.59, BUN 76, GFR 7, K+ 3.6. Last renal ultrasound 09/18/20 showed no medical renal disease, hydronephrosis, or mass. Likely due to hepatorenal syndrome and possible ATN due to third spacing of fluid and hypotension possibly due to sepsis. Patient appears dry on exam.   - Decrease LR to 100m56m  - Strict I&O - Daily weights  - Continuous telemetry monitoring   # Acute Hypoxic Respiratory Failure # Sepsis likely 2/2 PNA  Patient noted to be hypoxic in the ED, requiring  6L [place] to maintain saturations in the 90's. NRB placed as patient is a mouth breather. Afebrile, denying SOB, cough or sick contacts, although has significant rhonchi on examination with CXR showing R > L airspace opacification concerning for PNA and also CM with likely interstitial edema, possibly in setting of held Lasix and spironolactone. COVID-19 PCR negative. Lactic acid normal. Started on Cefepime, Vancomycin, and Metronidazole in the ED.    - Continuous pulse oximetry with supplemental O2 for SpO2 >92%  - Continue Cefepime 1g IV  daily - Continue Vancomycin per pharmacy  - Blood culture x 2 pending   # Possible Urinary Tract Infection  U/A with trace leukocytes, negative nitrites, 6-10 WBC's with clumping and many bacteria, concerning for UTI. Bladder scan did not show retention, with 119cc's; however, foley catheter was placed in the ED  - urine culture pending  - Continue foley catheter for now  # Normocytic Anemia  Hemoglobin 8.5. Denies any hematemesis, dark or bloody BM's, although hemoglobin has been down-trending from 14 in September. Had an EGD 2 months ago that was unremarkable although did have recent AVM bleeding requiring cauterization 06/2020. Suspect slow, ongoing bleed.   - Continue to monitor morning CBC's  # Thrombocytopenia, Chronic  Plt 63, stable, in setting of cirrhosis.   - Continue to monitor CBC's and for signs of bleeding  # Non-Anion Gap Metabolic Acidosis Bicarb 16, Anion Gap 13. Likely due to acute renal failure. Denies diarrhea despite lactulose. Lactic acid WNL.  - Continue to monitor daily   # NIDDM Type II Takes glipizide and Jardiance 48m daily at home. Last hemoglobin A1c was 5.6 08/21/20.   - Continue to monitor for now - Will hold home medications given acute renal failure - Start SSI if hyperglycemia continues   # GERD  - Continue Protonix 467mPO daily  Code Status: Full Code Diet: Renal with Fluid Restriction to 1.2L  IVF: LR @ 10064mr DVT PPx: SCD's  Dispo: Admit patient to Inpatient with expected length of stay greater than 2 midnights.  Signed: SpeJeralyn BennettD 10/14/2020, 10:07 PM  Pager: [phone] 5pm on weekdays and 1pm on weekends: On Call pager: [phone]

## 2020-09-28 ENCOUNTER — Inpatient Hospital Stay (HOSPITAL_COMMUNITY): Payer: Medicare HMO

## 2020-09-28 ENCOUNTER — Encounter (HOSPITAL_COMMUNITY): Payer: Self-pay | Admitting: Interventional Radiology

## 2020-09-28 ENCOUNTER — Other Ambulatory Visit (HOSPITAL_COMMUNITY): Payer: Medicare HMO

## 2020-09-28 ENCOUNTER — Other Ambulatory Visit: Payer: Self-pay | Admitting: Student

## 2020-09-28 DIAGNOSIS — K7290 Hepatic failure, unspecified without coma: Secondary | ICD-10-CM | POA: Diagnosis not present

## 2020-09-28 DIAGNOSIS — N185 Chronic kidney disease, stage 5: Secondary | ICD-10-CM

## 2020-09-28 DIAGNOSIS — N289 Disorder of kidney and ureter, unspecified: Secondary | ICD-10-CM | POA: Diagnosis not present

## 2020-09-28 DIAGNOSIS — R4182 Altered mental status, unspecified: Secondary | ICD-10-CM | POA: Diagnosis not present

## 2020-09-28 DIAGNOSIS — E119 Type 2 diabetes mellitus without complications: Secondary | ICD-10-CM

## 2020-09-28 DIAGNOSIS — R188 Other ascites: Secondary | ICD-10-CM

## 2020-09-28 DIAGNOSIS — K7460 Unspecified cirrhosis of liver: Secondary | ICD-10-CM | POA: Diagnosis not present

## 2020-09-28 DIAGNOSIS — J189 Pneumonia, unspecified organism: Secondary | ICD-10-CM | POA: Diagnosis not present

## 2020-09-28 DIAGNOSIS — N179 Acute kidney failure, unspecified: Secondary | ICD-10-CM

## 2020-09-28 DIAGNOSIS — J9601 Acute respiratory failure with hypoxia: Secondary | ICD-10-CM

## 2020-09-28 DIAGNOSIS — K767 Hepatorenal syndrome: Secondary | ICD-10-CM

## 2020-09-28 DIAGNOSIS — A419 Sepsis, unspecified organism: Secondary | ICD-10-CM | POA: Diagnosis not present

## 2020-09-28 HISTORY — PX: IR PARACENTESIS: IMG2679

## 2020-09-28 LAB — SODIUM, URINE, RANDOM: Sodium, Ur: 13 mmol/L

## 2020-09-28 LAB — BODY FLUID CELL COUNT WITH DIFFERENTIAL
Eos, Fluid: 0 %
Lymphs, Fluid: 50 %
Monocyte-Macrophage-Serous Fluid: 21 % — ABNORMAL LOW (ref 50–90)
Neutrophil Count, Fluid: 29 % — ABNORMAL HIGH (ref 0–25)
Total Nucleated Cell Count, Fluid: 119 cu mm (ref 0–1000)

## 2020-09-28 LAB — COMPREHENSIVE METABOLIC PANEL
ALT: 29 U/L (ref 0–44)
ALT: 28 U/L (ref 0–44)
AST: 39 U/L (ref 15–41)
AST: 35 U/L (ref 15–41)
Albumin: 3.2 g/dL — ABNORMAL LOW (ref 3.5–5.0)
Albumin: 3.2 g/dL — ABNORMAL LOW (ref 3.5–5.0)
Alkaline Phosphatase: 70 U/L (ref 38–126)
Alkaline Phosphatase: 67 U/L (ref 38–126)
Anion gap: 14 (ref 5–15)
Anion gap: 13 (ref 5–15)
BUN: 77 mg/dL — ABNORMAL HIGH (ref 8–23)
BUN: 79 mg/dL — ABNORMAL HIGH (ref 8–23)
CO2: 14 mmol/L — ABNORMAL LOW (ref 22–32)
CO2: 15 mmol/L — ABNORMAL LOW (ref 22–32)
Calcium: 8.1 mg/dL — ABNORMAL LOW (ref 8.9–10.3)
Calcium: 8.1 mg/dL — ABNORMAL LOW (ref 8.9–10.3)
Chloride: 102 mmol/L (ref 98–111)
Chloride: 103 mmol/L (ref 98–111)
Creatinine, Ser: 7.95 mg/dL — ABNORMAL HIGH (ref 0.61–1.24)
Creatinine, Ser: 7.86 mg/dL — ABNORMAL HIGH (ref 0.61–1.24)
GFR, Estimated: 7 mL/min — ABNORMAL LOW (ref >60)
GFR, Estimated: 7 mL/min — ABNORMAL LOW (ref >60)
Glucose, Bld: 199 mg/dL — ABNORMAL HIGH (ref 70–99)
Glucose, Bld: 186 mg/dL — ABNORMAL HIGH (ref 70–99)
Potassium: 4.1 mmol/L (ref 3.5–5.1)
Potassium: 4 mmol/L (ref 3.5–5.1)
Sodium: 130 mmol/L — ABNORMAL LOW (ref 135–145)
Sodium: 131 mmol/L — ABNORMAL LOW (ref 135–145)
Total Bilirubin: 2.4 mg/dL — ABNORMAL HIGH (ref 0.3–1.2)
Total Bilirubin: 2.2 mg/dL — ABNORMAL HIGH (ref 0.3–1.2)
Total Protein: 5.8 g/dL — ABNORMAL LOW (ref 6.5–8.1)
Total Protein: 5.8 g/dL — ABNORMAL LOW (ref 6.5–8.1)

## 2020-09-28 LAB — CBC WITH DIFFERENTIAL/PLATELET
Abs Immature Granulocytes: 0.05 10*3/uL (ref 0.00–0.07)
Abs Immature Granulocytes: 0.05 10*3/uL (ref 0.00–0.07)
Basophils Absolute: 0.1 10*3/uL (ref 0.0–0.1)
Basophils Absolute: 0 10*3/uL (ref 0.0–0.1)
Basophils Relative: 1 %
Basophils Relative: 1 %
Eosinophils Absolute: 0.2 10*3/uL (ref 0.0–0.5)
Eosinophils Absolute: 0.2 10*3/uL (ref 0.0–0.5)
Eosinophils Relative: 2 %
Eosinophils Relative: 2 %
HCT: 25.4 % — ABNORMAL LOW (ref 39.0–52.0)
HCT: 26.3 % — ABNORMAL LOW (ref 39.0–52.0)
Hemoglobin: 8.6 g/dL — ABNORMAL LOW (ref 13.0–17.0)
Hemoglobin: 8.4 g/dL — ABNORMAL LOW (ref 13.0–17.0)
Immature Granulocytes: 1 %
Immature Granulocytes: 1 %
Lymphocytes Relative: 12 %
Lymphocytes Relative: 9 %
Lymphs Abs: 1 10*3/uL (ref 0.7–4.0)
Lymphs Abs: 0.7 10*3/uL (ref 0.7–4.0)
MCH: 32.8 pg (ref 26.0–34.0)
MCH: 31.3 pg (ref 26.0–34.0)
MCHC: 33.9 g/dL (ref 30.0–36.0)
MCHC: 31.9 g/dL (ref 30.0–36.0)
MCV: 96.9 fL (ref 80.0–100.0)
MCV: 98.1 fL (ref 80.0–100.0)
Monocytes Absolute: 0.8 10*3/uL (ref 0.1–1.0)
Monocytes Absolute: 0.8 10*3/uL (ref 0.1–1.0)
Monocytes Relative: 10 %
Monocytes Relative: 10 %
Neutro Abs: 6 10*3/uL (ref 1.7–7.7)
Neutro Abs: 6 10*3/uL (ref 1.7–7.7)
Neutrophils Relative %: 74 %
Neutrophils Relative %: 77 %
Platelets: 71 10*3/uL — ABNORMAL LOW (ref 150–400)
Platelets: 69 10*3/uL — ABNORMAL LOW (ref 150–400)
RBC: 2.62 MIL/uL — ABNORMAL LOW (ref 4.22–5.81)
RBC: 2.68 MIL/uL — ABNORMAL LOW (ref 4.22–5.81)
RDW: 17.6 % — ABNORMAL HIGH (ref 11.5–15.5)
RDW: 17.7 % — ABNORMAL HIGH (ref 11.5–15.5)
WBC: 8.1 10*3/uL (ref 4.0–10.5)
WBC: 7.6 10*3/uL (ref 4.0–10.5)
nRBC: 0 % (ref 0.0–0.2)
nRBC: 0 % (ref 0.0–0.2)

## 2020-09-28 LAB — PROTIME-INR
INR: 1.7 — ABNORMAL HIGH (ref 0.8–1.2)
Prothrombin Time: 19 seconds — ABNORMAL HIGH (ref 11.4–15.2)

## 2020-09-28 LAB — GRAM STAIN

## 2020-09-28 LAB — HIV ANTIBODY (ROUTINE TESTING W REFLEX): HIV Screen 4th Generation wRfx: NONREACTIVE

## 2020-09-28 LAB — MRSA PCR SCREENING: MRSA by PCR: NEGATIVE

## 2020-09-28 LAB — CBG MONITORING, ED: Glucose-Capillary: 174 mg/dL — ABNORMAL HIGH (ref 70–99)

## 2020-09-28 LAB — PROCALCITONIN: Procalcitonin: 0.69 ng/mL

## 2020-09-28 LAB — MAGNESIUM: Magnesium: 2.3 mg/dL (ref 1.7–2.4)

## 2020-09-28 MED ORDER — LIDOCAINE HCL (PF) 1 % IJ SOLN
INTRAMUSCULAR | Status: AC | PRN
  Administered 2020-09-28: 10 mL

## 2020-09-28 MED ORDER — MIDODRINE HCL 5 MG PO TABS
10.0000 mg | ORAL_TABLET | Freq: Three times a day (TID) | ORAL | Status: DC
  Administered 2020-09-28: 10 mg via ORAL
  Filled 2020-09-28: qty 2

## 2020-09-28 MED ORDER — OCTREOTIDE ACETATE 100 MCG/ML IJ SOLN
200.0000 ug | Freq: Three times a day (TID) | INTRAMUSCULAR | Status: DC
  Administered 2020-09-28: 200 ug via SUBCUTANEOUS
  Filled 2020-09-28 (×3): qty 2

## 2020-09-28 MED ORDER — LIDOCAINE HCL 1 % IJ SOLN
INTRAMUSCULAR | Status: AC
  Filled 2020-09-28: qty 20

## 2020-09-28 MED ORDER — ALBUMIN HUMAN 25 % IV SOLN
25.0000 g | Freq: Four times a day (QID) | INTRAVENOUS | Status: DC
  Administered 2020-09-28 – 2020-09-29 (×5): 25 g via INTRAVENOUS
  Filled 2020-09-28 (×7): qty 100

## 2020-09-28 MED ORDER — PHYTONADIONE 5 MG PO TABS
10.0000 mg | ORAL_TABLET | Freq: Every day | ORAL | Status: DC
  Administered 2020-09-28: 10 mg via ORAL
  Filled 2020-09-28 (×2): qty 2

## 2020-09-28 MED ORDER — SODIUM CHLORIDE 0.9 % IV SOLN
50.0000 ug/h | INTRAVENOUS | Status: DC
  Filled 2020-09-28 (×2): qty 1

## 2020-09-28 MED ORDER — LACTATED RINGERS IV BOLUS
500.0000 mL | Freq: Once | INTRAVENOUS | Status: AC
  Administered 2020-09-28: 500 mL via INTRAVENOUS

## 2020-09-28 NOTE — ED Notes (Signed)
Pt had a bowel movement all over bed. Cleaned pt up and offered new sheets

## 2020-09-28 NOTE — ED Notes (Signed)
Pt continuously pulling off nonrebreather and monitoring devices. MD paged. New IV placed. Pt made comfortable and given warm blankets.

## 2020-09-28 NOTE — ED Notes (Signed)
Pt lethargic, but awoken to take meds.  Able to swallow without difficulty.  Swearing at staff and attempting to hit staff.Diarrhea x 2.

## 2020-09-28 NOTE — ED Notes (Signed)
Changed pt to 15 L nrb

## 2020-09-28 NOTE — ED Notes (Signed)
Placed back on NRB - sats increasing to 90's. Pt to IR.

## 2020-09-28 NOTE — ED Notes (Signed)
Breakfast Ordered 

## 2020-09-28 NOTE — Consult Note (Signed)
Nephrology Consult   Requesting provider: Velna Ochs, MD Service requesting consult: Internal Medicine Reason for consult: Acute renal failure    Assessment/Recommendations: Brad [name] is a/an 70 y.o. male with a past medical history significant for cirrhosis 2/2 NASH, esophageal variceal bleed s/p banding with EGD in 10/2019, AVMs requiring cauterization in 06/2020, GERD, hypertension, HLD, T2 DM who presented to [facility] emergency department with altered mental status.  Acute renal failure Baseline creatinine appears to be around 2.5 with most recent creatinine prior to arrival on 09/21/2020 which was 2.60.  Patient had renal ultrasound on previous admission on 09/18/2020 which showed no hydronephrosis, mass, or other renal disease.  On arrival to the emergency department patient's creatinine was 7.59 which increased to 7.90. Differential includes ATN secondary to third spacing as well as hypotension due to possible sepsis.  Hepatorenal syndrome is also a concern given patient's past medical history of NASH. -Continue to monitor daily Cr, Dose meds for GFR -Initiating Midrin, octreotide for management of hepatorenal syndrome -Monitor Daily I/Os, Daily weight  -Maintain MAP>65 for optimal renal perfusion.  -Avoid nephrotoxic medications including NSAIDs and Vanc/Zosyn combo -If continued worsening of creatinine recommend repeat renal ultrasound -Unclear if patient is a liver transplant candidate.  Primary team has reached out to [place] [facility] to determine his status and if he is a candidate recommend [facility]  -Currently no indication for HD  Concern for UTI Patient with minimal urine output since arrival. Urinalysis significant for small bilirubin, glucose 100, trace leukocytes, negative nitrite, protein 100, specific gravity greater than 1.030. Microscopic urinalysis showed many bacteria with WBC clumps, WBC 6-10. She received broad-spectrum antibiotics with cefepime,  vancomycin, metronidazole. -Continue management per primary team  Normocytic anemia Hemoglobin on arrival of 8.5.  Patient has history of bleeding varices as well as recent AVM requiring cauterization 06/2020. -Morning CBCs  Non-anion gap metabolic acidosis Bicarb of 16 with anion gap of 13.  Likely due to acute renal failure.  Hepatic encephalopathy in the setting of cirrhosis 2/2 NASH Presented with altered mental status concerning for encephalopathy.  Was admitted in January 2022 and discharged with lactulose and rifaximin.  His Lasix and spironolactone were held at discharge due to an AKI.  He has been noncompliant with medications since his discharge and reports pain missed his lactulose today.  Emergency room and they 131, T bili 1.9, albumin 3.3, INR 1.6.  Meld score of 36. -Primary team is managing  Acute hypoxic respiratory failure in the setting of sepsis likely 2/2 pneumonia Currently on 6 L nonrebreather with O2 saturation 99%. -Management per primary team  T2DM Medications include glipizide and Jardiance 10 mg daily. Most recent hemoglobin A1c was 5.6 on 08/21/2020. -Management per primary team  Thrombocytopenia Platelet 63 on admission, stable, management per primary team  Recommendations conveyed to primary service.    Gifford Shave, MD  PGY-2, [facility]  09/28/20 2:59 PM __________________________________________________________________________________ CC: Altered mental status  History of Present Illness: Brad [name] is a/an 70 y.o. male with a past medical history of cirrhosis 2/2 NASH, esophageal variceal bleed s/p banding, bleeding AVM requiring cauterization (06/2020), GERD, hypertension, hyperlipidemia, T2DM who presents with AMS.   Patient has had multiple hospitalizations for this over the past year with the most recent being in 09/12/2020. Came to the emergency department due to worsening abdominal swelling over the last week and that he  forgot to take his lactulose the morning prior to arrival. In the emergency department  he was hypotensive, bradycardic, and hypoxic so patient was fluid resuscitated with 1 L normal saline with initial improvements of blood pressure. CXR was concerning for pneumonia and urinalysis was concerning for UTI so he was started on broad-spectrum antibiotics. His creatinine was markedly elevated at 7.59 which increased to 7.90. The internal medicine team was contacted for admission and they subsequently consulted GI to assist with the management of hepatic encephalopathy and nephrology to assist with the management of the acute renal failure.   Medications:  Current Facility-Administered Medications  Medication Dose Route Frequency Provider Last Rate Last Admin  . albumin human 25 % solution 25 g  25 g Intravenous Q6H Vena Rua, PA-C 60 mL/hr at 09/28/20 1117 25 g at 09/28/20 1117  . ceFEPIme (MAXIPIME) 1 g in sodium chloride 0.9 % 100 mL IVPB  1 g Intravenous Q24H Bloomfield, Carley D, DO      . lactulose (CHRONULAC) 10 GM/15ML solution 30 g  30 g Oral QID Bloomfield, Carley D, DO   30 g at 09/28/20 1127  . lidocaine (XYLOCAINE) 1 % (with pres) injection           . pantoprazole (PROTONIX) EC tablet 40 mg  40 mg Oral Daily Bloomfield, Carley D, DO   40 mg at 09/28/20 1125  . rifaximin (XIFAXAN) tablet 550 mg  550 mg Oral BID Bloomfield, Carley D, DO   550 mg at 09/28/20 1125  . triamcinolone (KENALOG) 0.1 % cream 1 application  1 application Topical Daily PRN Bloomfield, Carley D, DO      . vancomycin variable dose per unstable renal function (pharmacist dosing)   Does not apply See admin instructions Modena Nunnery D, DO       Current Outpatient Medications  Medication Sig Dispense Refill  . allopurinol (ZYLOPRIM) 100 MG tablet Take 1 tablet (100 mg total) by mouth daily. Hold for 1 week to allow kidney function to improve 30 tablet 0  . CALCIUM PO Take 1 tablet by mouth at bedtime.    .  cetirizine (ZYRTEC) 10 MG tablet Take 2 tablets (20 mg total) by mouth daily. 60 tablet 0  . cholecalciferol (VITAMIN D) 25 MCG (1000 UNIT) tablet Take 1 tablet (1,000 Units total) by mouth daily. 30 tablet 0  . empagliflozin (JARDIANCE) 10 MG TABS tablet Take 1 tablet (10 mg total) by mouth daily. 30 tablet 0  . escitalopram (LEXAPRO) 10 MG tablet Take 1 tablet (10 mg total) by mouth daily. 30 tablet 0  . furosemide (LASIX) 40 MG tablet Take 2 tablets (80 mg total) by mouth daily. Hold for 1 week to allow kidney function to improve 60 tablet 0  . gabapentin (NEURONTIN) 600 MG tablet Take 1 tablet (600 mg total) by mouth 2 (two) times daily. 60 tablet 0  . glipiZIDE (GLUCOTROL) 5 MG tablet Take 2 tablets (10 mg total) by mouth daily before breakfast. 60 tablet 0  . hydrOXYzine (ATARAX/VISTARIL) 25 MG tablet Take 1 tablet (25 mg total) by mouth at bedtime as needed. 30 tablet 0  . lactulose (CHRONULAC) 10 GM/15ML solution Take 105 mLs (70 g total) by mouth See admin instructions. Mix 12 tbsp (120 g) in 1 pack cherry koolaid and 1 tbsp sugar/ divide into 4 doses and drink 4 times daily 1892 mL 0  . loratadine (CLARITIN) 10 MG tablet Take 1 tablet (10 mg total) by mouth at bedtime. 60 tablet 0  . Omega-3 Fatty Acids (FISH OIL) 1000 MG CAPS Take 1 capsule (  1,000 mg total) by mouth daily. 30 capsule 0  . ondansetron (ZOFRAN) 4 MG tablet Take 4 mg by mouth every 8 (eight) hours as needed for nausea or vomiting.    . Oxycodone HCl 10 MG TABS Take 10 mg by mouth 3 (three) times daily as needed for pain.  0  . pantoprazole (PROTONIX) 40 MG tablet Take 1 tablet (40 mg total) by mouth daily. 30 tablet 0  . rifaximin (XIFAXAN) 550 MG TABS tablet Take 1 tablet (550 mg total) by mouth 2 (two) times daily. 60 tablet 0  . solifenacin (VESICARE) 5 MG tablet Take 5 mg by mouth daily.    Marland Kitchen spironolactone (ALDACTONE) 100 MG tablet Take 1 tablet (100 mg total) by mouth 2 (two) times daily. Hold for 1 week to allow kidney  function to improve 60 tablet 0  . triamcinolone (KENALOG) 0.1 % Apply 1 application topically daily as needed (itching).       ALLERGIES Lisinopril and Penicillins  MEDICAL HISTORY Past Medical History:  Diagnosis Date  . Cirrhosis of liver not due to alcohol ([place])   . DDD (degenerative disc disease), lumbar   . Diabetes mellitus without complication ([place])   . Gout   . Hypertension      SOCIAL HISTORY Social History   Socioeconomic History  . Marital status: Married    Spouse name: Not on file  . Number of children: Not on file  . Years of education: Not on file  . Highest education level: Not on file  Occupational History  . Not on file  Tobacco Use  . Smoking status: Former Research scientist (life sciences)  . Smokeless tobacco: Never Used  Substance and Sexual Activity  . Alcohol use: No  . Drug use: No  . Sexual activity: Not on file  Other Topics Concern  . Not on file  Social History Narrative  . Not on file   Social Determinants of Health   Financial Resource Strain: Not on file  Food Insecurity: Not on file  Transportation Needs: Not on file  Physical Activity: Not on file  Stress: Not on file  Social Connections: Not on file  Intimate Partner Violence: Not on file     FAMILY HISTORY No family history on file.    Review of Systems: 12 systems reviewed Otherwise as per HPI, all other systems reviewed and negative  Physical Exam: Vitals:   09/28/20 0915 09/28/20 1030  BP:  (!) 101/51  Pulse: (!) 57 (!) 55  Resp:  19  Temp:  97.8 F (36.6 C)  SpO2: 91% 99%   Total I/O In: 1400 [I.V.:900; IV Piggyback:500] Out: 500 [Stool:500]  Intake/Output Summary (Last 24 hours) at 09/28/2020 1211 Last data filed at 09/28/2020 1116 Gross per 24 hour  Intake 2800 ml  Output 500 ml  Net 2300 ml   General: Sleeping when I enter the room, lethargic, extremely difficult to understand, chronically ill-appearing, no acute distress HEENT: anicteric sclera, oropharynx clear without  lesions CV: Bradycardic rate, normal rhythm, no murmurs, no gallops, no rubs, peripheral edema noted to the hip Lungs: Normal work of breathing, O2 saturations in the low nineties on 6 L nonrebreather, decreased air movement in right lung, appropriate air movement in left lung, no crackles noted in lung bases Abd: Nontender, distended with positive fluid wave Skin: no visible lesions or rashes Psych: Intermittently lethargic, responses are delayed but answers questions appropriately, dysphagia noted, able to follow commands Musculoskeletal:no obvious deformities Neuro: Patient has dysphagia with slowed mentation  Test Results Reviewed Lab Results  Component Value Date   NA 130 (L) 09/28/2020   K 4.1 09/28/2020   CL 102 09/28/2020   CO2 14 (L) 09/28/2020   BUN 77 (H) 09/28/2020   CREATININE 7.95 (H) 09/28/2020   CALCIUM 8.1 (L) 09/28/2020   ALBUMIN 3.2 (L) 09/28/2020     I have reviewed all relevant outside healthcare records related to the patient's current hospitalization

## 2020-09-28 NOTE — ED Notes (Signed)
Pt becoming increasingly agitated, pulling at his penis and foley .Pt defecated all over bed and continues to pull nrb off.

## 2020-09-28 NOTE — Procedures (Signed)
  Successful image-guided paracentesis from the LLQ abdomen.  Yielded 2 liters of clear yellow fluid. (2L maximum per ordering MD due to concern of kidney function) No immediate complications.  EBL = less than 1 cc. Patient tolerated well.   Specimen sent for labs.  Please see imaging section of Epic for full dictation.  Signed by Durenda Guthrie PA-C    .

## 2020-09-28 NOTE — Consult Note (Signed)
[facility] Consult: 8:25 AM 09/28/2020  LOS: 1 day    Referring Provider: Philipp Ovens MD Primary Care Physician:  Glendon Axe, MD in [facility] Primary Gastroenterologist:  [facility].  Lennox Solders PA-C in [place].  Dr. Ricci Barker in [facility].    Reason for Consultation:  Decompensated cirrhosis.     HPI: Brad [name] is a 70 y.o. male.  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  Ascites.  S/p paracentesis. On Lasix 80/Aldactone 200 daily.  HE, on Lactulose, rifaximin.  Splenomegaly, thrombocytopenia.  Htn.  Chronic pain.  DM 2.. S/p cholecystectomy.  CKD.    10/2019 EGD by Dr. Dorrene German.  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 Multiple moderate to large volume paracentesis including but not limited to:  08/09/2020 4.5 L paracentesis.  08/22/2020 4.8 L paracentesis, 09/19/2020 5.5 L paracentesis.  No SBP on either tap. 03/2019 CTAP with oral contrast.  Cirrhosis.  No focal hepatic lesions..  Anasarca.  Splenomegaly.  Mesenteric edema.  Prominent mesenteric collaterals.  Small amount ascites.  Recanalized umbilical vein.  Conventional hepatic arterial anatomy.  Patent portal and hepatic veins.  No biliary ductal dilatation.  Status post cholecystectomy.  Pancreas is atrophied with fatty infiltration.  Nodular left adrenal.  Punctate renal calculi.  Mild wall thickening of a sending colon, likely due to portal venous hypertension.  Left-sided diverticulosis.  Moderate atherosclerosis particular at infrarenal abdominal aorta. 07/18/2020 ultrasound abdomen with cirrhosis, no focal or acute abnormality.  No current ascites.  Normal-appearing kidneys.  Normal biliary tree  Admission 06/2020 with hepatic encephalopathy.  Upper GI bleed.  ABL. 07/18/2020 EGD by Dr. Ricci Barker.  Gastric AVMs oozing blood,  cauterized.  Left the hospital AMA so he could spend Thanksgiving with his family.  Felt to have mental capacity to make medical decisions.  Oxycodone, gabapentin held at discharge due to the initial AMS.  Discharged on short course of ciprofloxacin.  Protonix 40 twice daily for 30 days.  Aldactone 200 mg/day, Lasix 80 mg/day to restart 1125.  Iron sulfate 325 mg daily.  Lactulose 10 g 3 times daily.  Rifaximin twice daily Hb 10.9.  Platelets in the 60s to 70s.  Repeat admissions:   12/26 -08/24/2028 w HD, AKI, urinary retention. 1/22-1/26/2022 for HD.  AKI on CKD, dehydration vs HRS.  Anemia of chronic disease.  Presented to the [facility] ED today evening with AMS, infusion: Recurrent HD.  Reports increased abdominal girth.  He had forgotten to take his lactulose yesterday.  Normally has a single bowel movement per day but none yesterday.  No shortness of breath, chest or abdominal pain, dark or bloody stools, nausea, vomiting, hematemesis, oliguria.  Eating and drinking well and normally able to carry out usual daily activities.  In the ED he was hypotensive, slightly bradycardic, hypoxic.  Placed on 6 L nasal cannula then NRB placed due to significant mouth breathing.  BP improved with a liter of normal saline.  Foley catheter placed due to concern for urinary retention. At 0450 this morning patient removed all his IVs.  These were replaced and he now has a sitter at bedside.  This morning he had a loose, yellowish-brown stool there was no obvious blood or melena.  T bili 2.4.  Alk phos, transaminases normal.   Na 130.  BUN/creatinine 77/7.9.  GFR 7 Hgb 8.6. MCV normal. Platelets 71.  WBCs normal.  INR 1.6 U/A: many bacteria.  6-10 WBCs.  Neg nitrites.  Trace Leuks.    Head CT with stable atrophy and chronic small vessel ischemia.  New opacification left mastoid air cells. Meld 27.  Initiated on octreotide, Cefepime, Flagyl Protonix 40 mg daily.  150 g dose of albumin ordered.  Lactulose increased.   Xifaxan continued. 2 Liter paracentesis. No SBP on fluid studies.     Lives at home with his mother and brother.  Does not drink alcohol.  Past Medical History:  Diagnosis Date  . Cirrhosis of liver not due to alcohol ([place])   . DDD (degenerative disc disease), lumbar   . Diabetes mellitus without complication ([place])   . Gout   . Hypertension     Past Surgical History:  Procedure Laterality Date  . ADENOIDECTOMY    . ANKLE SURGERY    . CHOLECYSTECTOMY    . IR PARACENTESIS  08/22/2020  . IR PARACENTESIS  09/19/2020  . IR RADIOLOGIST EVAL & MGMT  02/10/2019  . LEFT HEART CATH AND CORONARY ANGIOGRAPHY N/A 07/29/2017   Procedure: LEFT HEART CATH AND CORONARY ANGIOGRAPHY;  Surgeon: Lorretta Harp, MD;  Location: [facility];  Service: Cardiovascular;  Laterality: N/A;  . TONSILLECTOMY      Prior to Admission medications   Medication Sig Start Date End Date Taking? Authorizing Provider  allopurinol (ZYLOPRIM) 100 MG tablet Take 1 tablet (100 mg total) by mouth daily. Hold for 1 week to allow kidney function to improve 09/22/20   Andrew Au, MD  CALCIUM PO Take 1 tablet by mouth at bedtime.    [provider]  cetirizine (ZYRTEC) 10 MG tablet Take 2 tablets (20 mg total) by mouth daily. 09/22/20   Andrew Au, MD  cholecalciferol (VITAMIN D) 25 MCG (1000 UNIT) tablet Take 1 tablet (1,000 Units total) by mouth daily. 09/22/20   Andrew Au, MD  empagliflozin (JARDIANCE) 10 MG TABS tablet Take 1 tablet (10 mg total) by mouth daily. 09/22/20   Andrew Au, MD  escitalopram (LEXAPRO) 10 MG tablet Take 1 tablet (10 mg total) by mouth daily. 09/22/20   Andrew Au, MD  furosemide (LASIX) 40 MG tablet Take 2 tablets (80 mg total) by mouth daily. Hold for 1 week to allow kidney function to improve 09/22/20   Andrew Au, MD  gabapentin (NEURONTIN) 600 MG tablet Take 1 tablet (600 mg total) by mouth 2 (two) times daily. 09/22/20   Andrew Au, MD  glipiZIDE  (GLUCOTROL) 5 MG tablet Take 2 tablets (10 mg total) by mouth daily before breakfast. 09/22/20   Andrew Au, MD  hydrOXYzine (ATARAX/VISTARIL) 25 MG tablet Take 1 tablet (25 mg total) by mouth at bedtime as needed. 09/22/20   Andrew Au, MD  lactulose (CHRONULAC) 10 GM/15ML solution Take 105 mLs (70 g total) by mouth See admin instructions. Mix 12 tbsp (120 g) in 1 pack cherry koolaid and 1 tbsp sugar/ divide into 4 doses and drink 4 times daily 09/22/20   Andrew Au, MD  loratadine (CLARITIN) 10 MG tablet Take 1 tablet (10 mg total) by mouth at bedtime.  09/22/20   Andrew Au, MD  Omega-3 Fatty Acids (FISH OIL) 1000 MG CAPS Take 1 capsule (1,000 mg total) by mouth daily. 09/22/20   Andrew Au, MD  ondansetron (ZOFRAN) 4 MG tablet Take 4 mg by mouth every 8 (eight) hours as needed for nausea or vomiting. 09/14/20   [provider]  Oxycodone HCl 10 MG TABS Take 10 mg by mouth 3 (three) times daily as needed for pain. 06/26/17   [provider]  pantoprazole (PROTONIX) 40 MG tablet Take 1 tablet (40 mg total) by mouth daily. 09/22/20   Andrew Au, MD  rifaximin (XIFAXAN) 550 MG TABS tablet Take 1 tablet (550 mg total) by mouth 2 (two) times daily. 09/22/20   Andrew Au, MD  solifenacin (VESICARE) 5 MG tablet Take 5 mg by mouth daily.    [provider]  spironolactone (ALDACTONE) 100 MG tablet Take 1 tablet (100 mg total) by mouth 2 (two) times daily. Hold for 1 week to allow kidney function to improve 09/22/20   Andrew Au, MD  triamcinolone (KENALOG) 0.1 % Apply 1 application topically daily as needed (itching). 05/19/20   [provider]    Scheduled Meds: . lactulose  30 g Oral QID  . pantoprazole  40 mg Oral Daily  . rifaximin  550 mg Oral BID  . vancomycin variable dose per unstable renal function (pharmacist dosing)   Does not apply See admin instructions   Infusions: . albumin human    . ceFEPime (MAXIPIME) IV    . lactated  ringers    . octreotide  (SANDOSTATIN)    IV infusion     PRN Meds: triamcinolone   Allergies as of 10/06/2020 - Review Complete 10/04/2020  Allergen Reaction Noted  . Lisinopril Swelling 06/07/2015  . Penicillins Swelling 12/28/2012    No family history on file.  Social History   Socioeconomic History  . Marital status: Married    Spouse name: Not on file  . Number of children: Not on file  . Years of education: Not on file  . Highest education level: Not on file  Occupational History  . Not on file  Tobacco Use  . Smoking status: Former Research scientist (life sciences)  . Smokeless tobacco: Never Used  Substance and Sexual Activity  . Alcohol use: No  . Drug use: No  . Sexual activity: Not on file  Other Topics Concern  . Not on file  Social History Narrative  . Not on file   Social Determinants of Health   Financial Resource Strain: Not on file  Food Insecurity: Not on file  Transportation Needs: Not on file  Physical Activity: Not on file  Stress: Not on file  Social Connections: Not on file  Intimate Partner Violence: Not on file    REVIEW OF SYSTEMS: See HPI.  Pt encephalopthy precludes obtaining all but bare minimum of ROS.   Denies abdominal pain   PHYSICAL EXAM: Vital signs in last 24 hours: Vitals:   09/28/20 0633 09/28/20 0730  BP:  (!) 81/70  Pulse:  (!) 58  Resp:  18  Temp: 97.6 F (36.4 C)   SpO2:  (!) 82%   Wt Readings from Last 3 Encounters:  09/29/2020 89.4 kg  09/21/20 89.4 kg  08/24/20 87.2 kg    General: Looks acutely and chronically ill.  Confused but alert.  Looks malnourished despite being overweight Head: No facial asymmetry.  No facial edema Eyes: No conjunctival pallor.  No scleral  icterus. Ears: No obvious hearing deficit Nose: No discharge Mouth: Oral mucosa is pink, clear, moist.  Tongue midline. Neck: No JVD, no masses, no thyromegaly Lungs: Wet/congested vocal quality.  Persistent clearing of his throat as he tries to speak.  Rhonchi  throughout bil.  Some increased work of breathing.  Patient just removed the oxygen mask, oxygen currently at 6 L/minute. Heart: RRR.  No MRG.  S1, S2. Abdomen: Large, not tender.  Soft.  No appreciable organomegaly, bruits, hernias..   Rectal: Not performed Musc/Skeltl: No joint redness, swelling or gross deformity. Extremities: No peripheral edema.  No scrotal edema. Neurologic: Oriented to his name.  Year is "[birth year]...", Could not come up with a twenty-two.  States he does not know where he is.  Positive asterixis.  No resting tremor. Skin: Purpura on his arms   Intake/Output from previous day: 02/01 0701 - 02/02 0700 In: 1400 [P.O.:300; IV Piggyback:1100] Out: -  Intake/Output this shift: Total I/O In: -  Out: 500 [Stool:500]  LAB RESULTS: Recent Labs    10/08/2020 1812 10/12/2020 1819 09/28/20 0344  WBC 6.5  --  8.1  HGB 8.5* 8.5* 8.6*  HCT 25.5* 25.0* 25.4*  PLT 63*  --  71*   BMET Lab Results  Component Value Date   NA 130 (L) 09/28/2020   NA 131 (L) 10/20/2020   NA 131 (L) 10/10/2020   K 4.1 09/28/2020   K 3.7 09/29/2020   K 3.6 10/07/2020   CL 102 09/28/2020   CL 101 10/15/2020   CL 102    CO2 14 (L) 09/28/2020   CO2 16 (L) 10/01/2020   CO2 21 (L) 09/21/2020   GLUCOSE 199 (H) 09/28/2020   GLUCOSE 156 (H) 10/24/2020   GLUCOSE 164 (H) 09/29/2020   BUN 77 (H) 09/28/2020   BUN 78 (H) 10/03/2020   BUN 76 (H) 10/22/2020   CREATININE 7.95 (H) 09/28/2020   CREATININE 7.90 (H) 10/21/2020   CREATININE 7.59 (H) 10/05/2020   CALCIUM 8.1 (L) 09/28/2020   CALCIUM 8.3 (L) 10/23/2020   CALCIUM 9.1 09/21/2020   LFT Recent Labs    10/13/2020 1812 09/28/20 0344  PROT 5.9* 5.8*  ALBUMIN 3.3* 3.2*  AST 35 39  ALT 27 29  ALKPHOS 72 70  BILITOT 1.9* 2.4*   PT/INR Lab Results  Component Value Date   INR 1.6 (H) 10/16/2020   INR 1.4 (H) 09/18/2020   INR 1.4 (H) 08/22/2020   Hepatitis Panel No results for input(s): HEPBSAG, HCVAB, HEPAIGM, HEPBIGM in the  last 72 hours. C-Diff No components found for: CDIFF Lipase     Component Value Date/Time   LIPASE 38 09/18/2020 0705    Drugs of Abuse     Component Value Date/Time   LABOPIA NONE DETECTED 09/18/2020 1020   COCAINSCRNUR NONE DETECTED 09/18/2020 1020   LABBENZ NONE DETECTED 09/18/2020 1020   AMPHETMU NONE DETECTED 09/18/2020 1020   THCU NONE DETECTED 09/18/2020 1020   LABBARB NONE DETECTED 09/18/2020 1020     RADIOLOGY STUDIES: CT Head Wo Contrast  Result Date: 10/15/2020 CLINICAL DATA:  Mental status change. EXAM: CT HEAD WITHOUT CONTRAST TECHNIQUE: Contiguous axial images were obtained from the base of the skull through the vertex without intravenous contrast. COMPARISON:  Head CT 08/21/2020 FINDINGS: Brain: Stable area of atrophy and chronic small vessel ischemia. No intracranial hemorrhage, mass effect, or midline shift. No hydrocephalus. The basilar cisterns are patent. No evidence of territorial infarct or acute ischemia. No extra-axial or intracranial fluid collection.  Vascular: Atherosclerosis of skullbase vasculature without hyperdense vessel or abnormal calcification. Skull: No fracture or focal lesion. Sinuses/Orbits: Scattered opacification of left mastoid air cells, new from prior exam. Occasional mucosal thickening of ethmoid air cells. No sinus fluid levels. Orbits are unremarkable. Other: Non fusion posterior arch of C1 is incidental. IMPRESSION: 1. No acute intracranial abnormality. 2. Stable atrophy and chronic small vessel ischemia. 3. Scattered opacification of left mastoid air cells, new from prior exam. Electronically Signed   By: Keith Rake M.D.   On: 10/15/2020 19:49   DG Chest Port 1 View  Result Date: 10/15/2020 CLINICAL DATA:  Altered mental status.  Questionable sepsis EXAM: PORTABLE CHEST 1 VIEW COMPARISON:  08/21/2020 FINDINGS: Two frontal portable radiographs. Midline trachea. Cardiomegaly accentuated by AP portable technique. Apparent right paratracheal  soft tissue fullness is favored to be technique related. Pulmonary interstitial prominence and indistinctness. Right greater than left airspace disease with relative sparing of the left upper lung. IMPRESSION: Right greater than left airspace disease, highly suspicious for pneumonia. Cardiomegaly with underlying interstitial thickening and indistinctness, likely due to a component of congestive heart failure. Aortic Atherosclerosis (ICD10-I70.0). Electronically Signed   By: Abigail Miyamoto M.D.   On: 10/22/2020 19:01      IMPRESSION:   *    Decompensated cirrhosis  *     Ascites.  No SBP on 2 prior paracenteses.  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  On chronic lactulose, rifaximin.  Having 1 bowel movement per day.  Forgot to take his lactulose 2/1.  *    Normocytic anemia.  Chronic  *    Thrombocytopenia.  Chronic  *   Coagulopathy.  *   AKI.  Current GFR is 7, it was 26 1 week ago. ?  HRS.  *   Hyponatremia.    *    Chest x-ray suspicious for pneumonia.  Broad-spectrum antibiotics in place  *    ?  UTI.  Recent issues with urinary retention.  Foley catheter in place.  *    Cardiomegaly, ? CHF.  2018 echo with LVEF 60 to 65%.    PLAN:     *   Stop octreotide.  Change albumin to 25 g every 6 hours. Midodrine?   *  2 gm Na restriction.    *   Check urine Na, ordered but not yet collected.  Follow CMET.  INR.    *   Renal consult.    *    scheduled albumin  *   Ultrasound to assess ascites.     Azucena Freed  09/28/2020, 8:25 AM Phone [phone]

## 2020-09-28 NOTE — Progress Notes (Signed)
HD#1 Subjective:  Overnight Events: Patient pulled out all his IVs.   This AM, patient assessed at bedside with his NRB mask taken off. Patient endorsed nausea and SOB but denied any chest pain, abd pain, dysuria, vomiting, or lightheadedness. States he missed his lactulose yesterday. He has 1 bowel movement a day, but did not have one yesterday.   Objective:  Vital signs in last 24 hours: Vitals:   09/28/20 0515 09/28/20 0600 09/28/20 0633 09/28/20 0730  BP: (!) 98/50 (!) 111/52  (!) 81/70  Pulse: (!) 57 (!) 53  (!) 58  Resp: 12 11  18   Temp:   97.6 F (36.4 C)   TempSrc:   Oral   SpO2: 93% 96%  (!) 82%  Weight:      Height:       Supplemental O2: Non Rebreather SpO2: 98 % O2 Flow Rate (L/min): 6 L/min  Physical Exam:   General: Ill-appearing and tired-looking man laying in bed on NRB. No acute distress. HEENT: [place]/AT. Dry mucous membrane.  CV: Bradycardic. Regular rhythm. No m/r/g. Trace BLE edema Pulmonary: On 6L NRB. Lungs sound rhonchus. Normal effort. No accessory muscle use. No wheezing.  Abdominal: Soft, non-tender. Severe distention with positive fluid wave. Tympanic abdomen. No bowel sounds.  Extremities: Palpable dorsalis pedis and radialis pulses. Normal ROM. Skin: Warm and dry. No spider angioma. Some purpura on upper extremities.  Neuro: A&Ox3. Moves all extremities. Difficulty with articulation. Normal sensation. No focal deficit. Positive asterixis in bilateral upper extremities. No resting tremors. Psych: Mild agitation  Filed Weights   10/06/2020 1804  Weight: 89.4 kg     Intake/Output Summary (Last 24 hours) at 09/28/2020 0800 Last data filed at 09/28/2020 0618 Gross per 24 hour  Intake 1400 ml  Output --  Net 1400 ml   Net IO Since Admission: 1,400 mL [09/28/20 0800]  Recent Labs    09/28/20 0755  GLUCAP 174*     Pertinent Labs: CBC Latest Ref Rng & Units 09/28/2020 10/07/2020 10/15/2020  WBC 4.0 - 10.5 K/uL 8.1 - 6.5  Hemoglobin 13.0 - 17.0  g/dL 8.6(L) 8.5(L) 8.5(L)  Hematocrit 39.0 - 52.0 % 25.4(L) 25.0(L) 25.5(L)  Platelets 150 - 400 K/uL 71(L) - 63(L)    CMP Latest Ref Rng & Units 09/28/2020 10/13/2020 10/18/2020  Glucose 70 - 99 mg/dL 199(H) 156(H) 164(H)  BUN 8 - 23 mg/dL 77(H) 78(H) 76(H)  Creatinine 0.61 - 1.24 mg/dL 7.95(H) 7.90(H) 7.59(H)  Sodium 135 - 145 mmol/L 130(L) 131(L) 131(L)  Potassium 3.5 - 5.1 mmol/L 4.1 3.7 3.6  Chloride 98 - 111 mmol/L 102 101 102  CO2 22 - 32 mmol/L 14(L) - 16(L)  Calcium 8.9 - 10.3 mg/dL 8.1(L) - 8.3(L)  Total Protein 6.5 - 8.1 g/dL 5.8(L) - 5.9(L)  Total Bilirubin 0.3 - 1.2 mg/dL 2.4(H) - 1.9(H)  Alkaline Phos 38 - 126 U/L 70 - 72  AST 15 - 41 U/L 39 - 35  ALT 0 - 44 U/L 29 - 27    Imaging: CT Head Wo Contrast  Result Date: 10/22/2020 CLINICAL DATA:  Mental status change. EXAM: CT HEAD WITHOUT CONTRAST TECHNIQUE: Contiguous axial images were obtained from the base of the skull through the vertex without intravenous contrast. COMPARISON:  Head CT 08/21/2020 FINDINGS: Brain: Stable area of atrophy and chronic small vessel ischemia. No intracranial hemorrhage, mass effect, or midline shift. No hydrocephalus. The basilar cisterns are patent. No evidence of territorial infarct or acute ischemia. No extra-axial or intracranial  fluid collection. Vascular: Atherosclerosis of skullbase vasculature without hyperdense vessel or abnormal calcification. Skull: No fracture or focal lesion. Sinuses/Orbits: Scattered opacification of left mastoid air cells, new from prior exam. Occasional mucosal thickening of ethmoid air cells. No sinus fluid levels. Orbits are unremarkable. Other: Non fusion posterior arch of C1 is incidental. IMPRESSION: 1. No acute intracranial abnormality. 2. Stable atrophy and chronic small vessel ischemia. 3. Scattered opacification of left mastoid air cells, new from prior exam. Electronically Signed   By: Keith Rake M.D.   On: 10/24/2020 19:49   DG Chest Port 1 View  Result  Date: 10/10/2020 CLINICAL DATA:  Altered mental status.  Questionable sepsis EXAM: PORTABLE CHEST 1 VIEW COMPARISON:  08/21/2020 FINDINGS: Two frontal portable radiographs. Midline trachea. Cardiomegaly accentuated by AP portable technique. Apparent right paratracheal soft tissue fullness is favored to be technique related. Pulmonary interstitial prominence and indistinctness. Right greater than left airspace disease with relative sparing of the left upper lung. IMPRESSION: Right greater than left airspace disease, highly suspicious for pneumonia. Cardiomegaly with underlying interstitial thickening and indistinctness, likely due to a component of congestive heart failure. Aortic Atherosclerosis (ICD10-I70.0). Electronically Signed   By: Abigail Miyamoto M.D.   On: 10/23/2020 19:01    Assessment/Plan:   Active Problems:   Sepsis with acute hypoxic respiratory failure without septic shock [facility])  Patient Summary: Brad [name] is a 70 y.o. with PMH of cirrhosis 2/2 NASH diagnosed in 2015, esophageal variceal bleed s/p banding in 2021 with EGD 10/2019 that was unremarkable, bleeding AVM's requiring cauterization 06/2020, GERD, HTN, HLD, and controlled Type II DM with a recent hospitalization (1/22-1/26) who presented to the ED due to [place] and found to have decompensated cirrhosis, acute renal failure and acute hypoxic respiratory failure 2/2 to pneumonia.   Decompensated cirrhosis Hepatic encephalopathy Ascites Child Pugh: Class B. MELD score: 30. Patient with hx of cirrhosis 2/2 NASH found to be in decompensated cirrhoisis on admission due to likely medication adherence. Patient is on chronic lactulose and rifaximin. Report having 1 bowel movement a day but forgot to take his lactulose yesterday. Patient with mild encephalopathic, moderate abdominal ascites, asterixis on exam. S/p diagnostic paracentesis with 2L out. Found to be in acute renal failure likely 2/2 to hepatorenal syndrome. No signs of SBP. Patient  deem not a transplant candidate in the past because he was clinically better with low MELD of 10. Patient now with elevated MELD score so discussed with pt's hepatologist to assess need for liver transplant. His hepatologist recommend starting the transfer process and stabilizing patient. Transplant team will re-evaluate his candidacy for transplant.  --GI consulted, appreciate eval --Albumin 25 g q6h --Rifaximin 550 mg BID  --Octreotide 200 mcg subcu TID --Lactulose 30 g four times a day, goal of 4 BM daily --F/u body fluid labs --F/u abdominal U/S --Trend CMP, CBC and PT/INR --Continue holding all centrally acting meds. --Pending evaluation for transplant at [facility]  AHRF 2/2 pneumonia Found to be hypoxic on admission with CXR showing findings consistent with pneumonia. Started on empirical abx with Lucianne Lei and Cefepime. Still requiring 6 L NRB to maintain O2 above 90%. Closely monitor respiratory status.  --Continue Cefepime and Vanc --Bcx no growth in less than 12 hrs --Supplemental O2 to keep O2 above 92%  Acute Anuric Renal Failure Hepatorenal syndrome NAGMA Found to have acute renal failure with Cr of 7.59 on admission with baseline around 2.5. Also has have Rouse 2/2 to his acute renal failure. Patient has not made  any urine since admission. Worsening renal function today.  --Nephro consulted, appreciate eval     --Midodrine 10 mg TID and Octreotide 200 mcg TID for HRS     --Recommend repeat renal U/S if renal function does not improve     --Hold fluids or diuresis     --No indication for HD at the moment --Daily I/Os, weight --Renal okay with transfer to [facility] if patient is a transplant candidate.   UTI? Patient with urinary retention. UA showed some WBC and some protein, but no nitrite. Foley placed at admission. No urine output yet.  --Ucx pending --Continue foley --Strict I/Os  Normocytic anemia Thrombocytopenia Chronic and stable. Platelet 63 on admission improved to 69.  Hgb stable at 8.4. No signs of active bleed. Will continue to monitor. --Daily CBC  T2DM  A1c of 5.6 five weeks ago. On glipizide and Jardiance at home. Blood sugar in the 150s-190s.  --CBG monitoring  GERD Stable. --Continue home protonix  Diet: Renal diet IVF: None VTE: SCDs Start: 10/10/2020 2144 Code: Full PT/OT: Pending ID:  Anti-infectives (From admission, onward)   Start     Dose/Rate Route Frequency Ordered Stop   09/29/20 1800  vancomycin (VANCOREADY) IVPB 1750 mg/350 mL  Status:  Discontinued        1,750 mg 175 mL/hr over 120 Minutes Intravenous Every 48 hours 10/04/2020 1809 10/03/2020 1850   09/28/20 1800  ceFEPIme (MAXIPIME) 1 g in sodium chloride 0.9 % 100 mL IVPB        1 g 200 mL/hr over 30 Minutes Intravenous Every 24 hours 10/02/2020 1850     09/28/20 0030  rifaximin (XIFAXAN) tablet 550 mg        550 mg Oral 2 times daily 10/04/2020 2349     10/09/2020 1852  vancomycin variable dose per unstable renal function (pharmacist dosing)         Does not apply See admin instructions 10/16/2020 1852     10/01/2020 1815  ceFEPIme (MAXIPIME) 2 g in sodium chloride 0.9 % 100 mL IVPB  Status:  Discontinued        2 g 200 mL/hr over 30 Minutes Intravenous  Once 10/15/2020 1801 10/19/2020 1807   10/11/2020 1815  metroNIDAZOLE (FLAGYL) IVPB 500 mg        500 mg 100 mL/hr over 60 Minutes Intravenous  Once 10/02/2020 1801 10/17/2020 2002   10/13/2020 1815  vancomycin (VANCOCIN) IVPB 1000 mg/200 mL premix  Status:  Discontinued        1,000 mg 200 mL/hr over 60 Minutes Intravenous  Once 10/02/2020 1801 10/23/2020 1804   10/05/2020 1815  vancomycin (VANCOREADY) IVPB 2000 mg/400 mL        2,000 mg 200 mL/hr over 120 Minutes Intravenous  Once 10/15/2020 1804 09/29/2020 2232   10/24/2020 1815  ceFEPIme (MAXIPIME) 2 g in sodium chloride 0.9 % 100 mL IVPB  Status:  Discontinued        2 g 200 mL/hr over 30 Minutes Intravenous Every 12 hours 10/20/2020 1807 10/21/2020 1850       Anticipated discharge to [facility] in  2-3 days pending medical work up.  Lacinda Axon, MD 09/28/2020, 8:00 AM Pager: [facility]  Please contact the on call pager after 5 pm and on weekends at [phone].

## 2020-09-28 NOTE — ED Notes (Signed)
Pt is back from IR...connected back to monitors and O2..the pt is resting comfortably.

## 2020-09-28 NOTE — ED Notes (Signed)
Pt has pulled out all of his IV's. Attempting to get in touch with MD.

## 2020-09-28 NOTE — ED Notes (Signed)
Pt to US.

## 2020-09-28 NOTE — ED Notes (Signed)
Pt placed on 5L [place] to eat breakfast.  Sats of 89%.  Will place back on NRB when he is finished breakfast.

## 2020-09-29 DIAGNOSIS — A419 Sepsis, unspecified organism: Principal | ICD-10-CM

## 2020-09-29 DIAGNOSIS — J189 Pneumonia, unspecified organism: Secondary | ICD-10-CM

## 2020-09-29 DIAGNOSIS — K7460 Unspecified cirrhosis of liver: Secondary | ICD-10-CM | POA: Diagnosis not present

## 2020-09-29 LAB — I-STAT ARTERIAL BLOOD GAS, ED
Acid-base deficit: 11 mmol/L — ABNORMAL HIGH (ref 0.0–2.0)
Bicarbonate: 15 mmol/L — ABNORMAL LOW (ref 20.0–28.0)
Calcium, Ion: 1.19 mmol/L (ref 1.15–1.40)
HCT: 22 % — ABNORMAL LOW (ref 39.0–52.0)
Hemoglobin: 7.5 g/dL — ABNORMAL LOW (ref 13.0–17.0)
O2 Saturation: 97 %
Patient temperature: 97.7
Potassium: 4.2 mmol/L (ref 3.5–5.1)
Sodium: 133 mmol/L — ABNORMAL LOW (ref 135–145)
TCO2: 16 mmol/L — ABNORMAL LOW (ref 22–32)
pCO2 arterial: 31.3 mmHg — ABNORMAL LOW (ref 32.0–48.0)
pH, Arterial: 7.285 — ABNORMAL LOW (ref 7.350–7.450)
pO2, Arterial: 93 mmHg (ref 83.0–108.0)

## 2020-09-29 LAB — COMPREHENSIVE METABOLIC PANEL
ALT: 25 U/L (ref 0–44)
AST: 30 U/L (ref 15–41)
Albumin: 3.8 g/dL (ref 3.5–5.0)
Alkaline Phosphatase: 62 U/L (ref 38–126)
Anion gap: 16 — ABNORMAL HIGH (ref 5–15)
BUN: 88 mg/dL — ABNORMAL HIGH (ref 8–23)
CO2: 14 mmol/L — ABNORMAL LOW (ref 22–32)
Calcium: 8.6 mg/dL — ABNORMAL LOW (ref 8.9–10.3)
Chloride: 104 mmol/L (ref 98–111)
Creatinine, Ser: 8.84 mg/dL — ABNORMAL HIGH (ref 0.61–1.24)
GFR, Estimated: 6 mL/min — ABNORMAL LOW (ref >60)
Glucose, Bld: 167 mg/dL — ABNORMAL HIGH (ref 70–99)
Potassium: 4.3 mmol/L (ref 3.5–5.1)
Sodium: 134 mmol/L — ABNORMAL LOW (ref 135–145)
Total Bilirubin: 2.3 mg/dL — ABNORMAL HIGH (ref 0.3–1.2)
Total Protein: 6.1 g/dL — ABNORMAL LOW (ref 6.5–8.1)

## 2020-09-29 LAB — CBC
HCT: 25.3 % — ABNORMAL LOW (ref 39.0–52.0)
Hemoglobin: 8.8 g/dL — ABNORMAL LOW (ref 13.0–17.0)
MCH: 34 pg (ref 26.0–34.0)
MCHC: 34.8 g/dL (ref 30.0–36.0)
MCV: 97.7 fL (ref 80.0–100.0)
Platelets: 79 10*3/uL — ABNORMAL LOW (ref 150–400)
RBC: 2.59 MIL/uL — ABNORMAL LOW (ref 4.22–5.81)
RDW: 17.9 % — ABNORMAL HIGH (ref 11.5–15.5)
WBC: 9.1 10*3/uL (ref 4.0–10.5)
nRBC: 0 % (ref 0.0–0.2)

## 2020-09-29 LAB — PROTIME-INR
INR: 1.7 — ABNORMAL HIGH (ref 0.8–1.2)
Prothrombin Time: 19 s — ABNORMAL HIGH (ref 11.4–15.2)

## 2020-09-29 LAB — PATHOLOGIST SMEAR REVIEW

## 2020-09-29 LAB — URINE CULTURE: Culture: NO GROWTH

## 2020-09-29 LAB — LACTIC ACID, PLASMA: Lactic Acid, Venous: 1.4 mmol/L (ref 0.5–1.9)

## 2020-09-29 LAB — MAGNESIUM: Magnesium: 3.5 mg/dL — ABNORMAL HIGH (ref 1.7–2.4)

## 2020-09-29 LAB — PHOSPHORUS: Phosphorus: 10 mg/dL — ABNORMAL HIGH (ref 2.5–4.6)

## 2020-09-29 LAB — VANCOMYCIN, RANDOM: Vancomycin Rm: 17

## 2020-09-29 MED ORDER — SODIUM BICARBONATE 8.4 % IV SOLN
50.0000 meq | Freq: Once | INTRAVENOUS | Status: AC
  Administered 2020-09-29: 50 meq via INTRAVENOUS
  Filled 2020-09-29: qty 50

## 2020-09-29 MED ORDER — CHLORHEXIDINE GLUCONATE CLOTH 2 % EX PADS
6.0000 | MEDICATED_PAD | Freq: Every day | CUTANEOUS | Status: DC
  Administered 2020-09-29: 6 via TOPICAL

## 2020-09-29 MED ORDER — ACETAMINOPHEN 650 MG RE SUPP: 650.0000 mg | Freq: Four times a day (QID) | RECTAL | Status: DC | PRN

## 2020-09-29 MED ORDER — EPINEPHRINE HCL 5 MG/250ML IV SOLN IN NS: 0.5000 ug/min | INTRAVENOUS | Status: DC

## 2020-09-29 MED ORDER — ORAL CARE MOUTH RINSE
15.0000 mL | Freq: Two times a day (BID) | OROMUCOSAL | Status: DC
  Administered 2020-09-29: 15 mL via OROMUCOSAL

## 2020-09-29 MED ORDER — ROCURONIUM BROMIDE 50 MG/5ML IV SOLN: 100.0000 mg | Freq: Once | INTRAVENOUS | Status: DC

## 2020-09-29 MED ORDER — DIPHENHYDRAMINE HCL 50 MG/ML IJ SOLN: 25.0000 mg | INTRAMUSCULAR | Status: DC | PRN

## 2020-09-29 MED ORDER — DEXTROSE 5 % IV SOLN: INTRAVENOUS | Status: DC

## 2020-09-29 MED ORDER — NOREPINEPHRINE 4 MG/250ML-% IV SOLN
2.0000 ug/min | INTRAVENOUS | Status: DC
  Administered 2020-09-29: 2 ug/min via INTRAVENOUS
  Filled 2020-09-29: qty 250

## 2020-09-29 MED ORDER — EPINEPHRINE HCL 5 MG/250ML IV SOLN IN NS
INTRAVENOUS | Status: AC
  Administered 2020-09-29: 03:00:00 2 ug/min
  Filled 2020-09-29: qty 250

## 2020-09-29 MED ORDER — ETOMIDATE 2 MG/ML IV SOLN: 10.0000 mg | Freq: Once | INTRAVENOUS | Status: DC

## 2020-09-29 MED ORDER — MORPHINE SULFATE (PF) 2 MG/ML IV SOLN
2.0000 mg | INTRAVENOUS | Status: DC | PRN
  Administered 2020-09-29 – 2020-09-30 (×4): 2 mg via INTRAVENOUS
  Filled 2020-09-29 (×5): qty 1

## 2020-09-29 MED ORDER — ATROPINE SULFATE 1 MG/10ML IJ SOSY
1.0000 mg | PREFILLED_SYRINGE | Freq: Once | INTRAMUSCULAR | Status: AC
  Administered 2020-09-29: 1 mg via INTRAVENOUS

## 2020-09-29 MED ORDER — VITAMIN K1 10 MG/ML IJ SOLN
10.0000 mg | Freq: Every day | INTRAVENOUS | Status: DC
  Filled 2020-09-29 (×2): qty 1

## 2020-09-29 MED ORDER — GLYCOPYRROLATE 0.2 MG/ML IJ SOLN: 0.2000 mg | INTRAMUSCULAR | Status: DC | PRN

## 2020-09-29 MED ORDER — ATROPINE SULFATE 1 MG/10ML IJ SOSY
PREFILLED_SYRINGE | INTRAMUSCULAR | Status: AC
  Filled 2020-09-29: qty 10

## 2020-09-29 MED ORDER — GLYCOPYRROLATE 0.2 MG/ML IJ SOLN
0.2000 mg | INTRAMUSCULAR | Status: DC | PRN
  Administered 2020-09-29 – 2020-09-30 (×2): 0.2 mg via INTRAVENOUS
  Filled 2020-09-29 (×2): qty 1

## 2020-09-29 MED ORDER — GLYCOPYRROLATE 1 MG PO TABS: 1.0000 mg | ORAL_TABLET | ORAL | Status: DC | PRN

## 2020-09-29 MED ORDER — ACETAMINOPHEN 325 MG PO TABS: 650.0000 mg | ORAL_TABLET | Freq: Four times a day (QID) | ORAL | Status: DC | PRN

## 2020-09-29 MED ORDER — SODIUM CHLORIDE 0.9 % IV SOLN
250.0000 mL | INTRAVENOUS | Status: DC
  Administered 2020-09-29: 250 mL via INTRAVENOUS

## 2020-09-29 MED ORDER — POLYVINYL ALCOHOL 1.4 % OP SOLN
1.0000 [drp] | Freq: Four times a day (QID) | OPHTHALMIC | Status: DC | PRN
  Administered 2020-09-30: 1 [drp] via OPHTHALMIC
  Filled 2020-09-29: qty 15

## 2020-09-29 NOTE — Progress Notes (Signed)
[name] Progress Note    Assessment/ Plan:   Assessment/Recommendations: Brad [name] is a/an 70 y.o. male with a past medical history significant for cirrhosis 2/2 NASH, esophageal variceal bleed s/p banding with EGD in 10/2019, AVMs requiring cauterization in 06/2020, GERD, hypertension, HLD, T2 DM who presented to [facility] emergency department with altered mental status.  Acute renal failure Baseline creatinine appears to be around 2.5 with most recent creatinine prior to arrival on 09/21/2020 which was 2.60.  Patient had renal ultrasound on previous admission on 09/18/2020 which showed no hydronephrosis, mass, or other renal disease.  On arrival to the emergency department patient's creatinine was 7.59 which increased to 7.90.  Creatinine increased today to 8.84 with a BUN of 88.  Most likely etiology is hepatorenal syndrome.  ATN may also be playing a role given episodes of hypotension.  Overnight patient's clinical status worsened and he was transferred to the ICU and placed on the Levophed drip. -Continue to monitor daily Cr, Dose meds for GFR -Continue midodrine and octreotide as well as albumin for treatment of hepatorenal syndrome -Monitor Daily I/Os, Daily weight  -Maintain MAP>65 for optimal renal perfusion.  -Avoid nephrotoxic medications including NSAIDs and Vanc/Zosyn combo -Currently in the ICU and recently transitioned to DNR status, discussion today regarding withdrawal of care and comfort measures with the patient's family -Nephrology is signing off  Concern for UTI Patient with minimal urine output since arrival. Urinalysis significant for small bilirubin, glucose 100, trace leukocytes, negative nitrite, protein 100, specific gravity greater than 1.030. Microscopic urinalysis showed many bacteria with WBC clumps, WBC 6-10. She received broad-spectrum antibiotics with cefepime, vancomycin, metronidazole. -Continue management per primary team  Normocytic  anemia Hemoglobin on arrival of 8.5.  Patient has history of bleeding varices as well as recent AVM requiring cauterization 06/2020. -Morning CBCs  Non-anion gap metabolic acidosis Bicarb of 16 with anion gap of 13.  Likely due to acute renal failure.  Hepatic encephalopathy in the setting of cirrhosis 2/2 NASH Presented with altered mental status concerning for encephalopathy.  Was admitted in January 2022 and discharged with lactulose and rifaximin.  His Lasix and spironolactone were held at discharge due to an AKI.  He has been noncompliant with medications since his discharge and reports pain missed his lactulose today.  Emergency room and they 131, T bili 1.9, albumin 3.3, INR 1.6.  Meld score of 36. -Primary team is managing  Acute hypoxic respiratory failure in the setting of sepsis likely 2/2 pneumonia Currently on 6 L nonrebreather with O2 saturation 99%. -Management per primary team  T2DM Medications include glipizide and Jardiance 10 mg daily. Most recent hemoglobin A1c was 5.6 on 08/21/2020. -Management per primary team  Thrombocytopenia Platelet 63 on admission, stable, management per primary team Subjective:   Patient's clinical status worsened overnight creasing Lee confused with worsening mentation.  He had an increased O2 requirement to 15 L nonrebreather.  CCM was contacted and he was transferred to their service.  He was started on Levophed and all medications were continued.  There was a discussion with the family and patient's CODE STATUS was turned to DNR.  Palliative care has been consulted   Objective:   BP (!) 115/58   Pulse 65   Temp 97.7 F (36.5 C) (Axillary)   Resp 14   Ht 5' 10"  (1.778 m)   Wt 89.4 kg   SpO2 97%   BMI 28.28 kg/m   Intake/Output Summary (Last 24 hours) at 09/29/2020 [phone]  Last data filed at 09/28/2020 1944 Gross per 24 hour  Intake 1499.67 ml  Output 50 ml  Net 1449.67 ml   Weight change:   Physical Exam: Gen: Critically ill,  unresponsive laying in bed CVS: Bradycardic with no murmurs appreciated Resp: Respiratory rate of approximately 8 on evaluation Abd: Soft, nontender, positive bowel sounds Ext: No edema noted in lower extremities to his thighs  Imaging: CT Head Wo Contrast  Result Date: 10/10/2020 CLINICAL DATA:  Mental status change. EXAM: CT HEAD WITHOUT CONTRAST TECHNIQUE: Contiguous axial images were obtained from the base of the skull through the vertex without intravenous contrast. COMPARISON:  Head CT 08/21/2020 FINDINGS: Brain: Stable area of atrophy and chronic small vessel ischemia. No intracranial hemorrhage, mass effect, or midline shift. No hydrocephalus. The basilar cisterns are patent. No evidence of territorial infarct or acute ischemia. No extra-axial or intracranial fluid collection. Vascular: Atherosclerosis of skullbase vasculature without hyperdense vessel or abnormal calcification. Skull: No fracture or focal lesion. Sinuses/Orbits: Scattered opacification of left mastoid air cells, new from prior exam. Occasional mucosal thickening of ethmoid air cells. No sinus fluid levels. Orbits are unremarkable. Other: Non fusion posterior arch of C1 is incidental. IMPRESSION: 1. No acute intracranial abnormality. 2. Stable atrophy and chronic small vessel ischemia. 3. Scattered opacification of left mastoid air cells, new from prior exam. Electronically Signed   By: Brad [name] M.D.   On: 10/01/2020 19:49   US Abdomen Limited  Result Date: 09/28/2020 CLINICAL DATA:  Evaluate for ascites. EXAM: LIMITED ABDOMEN ULTRASOUND FOR ASCITES TECHNIQUE: Limited ultrasound survey for ascites was performed in all four abdominal quadrants. COMPARISON:  08/21/2020 FINDINGS: Four quadrant ultrasound of the abdomen reveals a small volume of ascites within all 4 quadrants. IMPRESSION: 1. Small volume of ascites noted within all 4 quadrants of the abdomen Electronically Signed   By: Brad [name] M.D.   On: 09/28/2020  18:44   DG Chest Port 1 View  Result Date: 10/18/2020 CLINICAL DATA:  Altered mental status.  Questionable sepsis EXAM: PORTABLE CHEST 1 VIEW COMPARISON:  08/21/2020 FINDINGS: Two frontal portable radiographs. Midline trachea. Cardiomegaly accentuated by AP portable technique. Apparent right paratracheal soft tissue fullness is favored to be technique related. Pulmonary interstitial prominence and indistinctness. Right greater than left airspace disease with relative sparing of the left upper lung. IMPRESSION: Right greater than left airspace disease, highly suspicious for pneumonia. Cardiomegaly with underlying interstitial thickening and indistinctness, likely due to a component of congestive heart failure. Aortic Atherosclerosis (ICD10-I70.0). Electronically Signed   By: Abigail Miyamoto M.D.   On: 10/10/2020 19:01   IR Paracentesis  Result Date: 09/28/2020 INDICATION: Patient with NASH cirrhosis with recurrent ascites, presented to IR for paracentesis EXAM: ULTRASOUND GUIDED diagnostic and therapeutic  PARACENTESIS MEDICATIONS: N10 ccnlidocaine 1 %e. COMPLICATIONS: None immediate. PROCEDURE: Informed written consent was obtained from the patient's brother after a discussion of the risks, benefits and alternatives to treatment. A timeout was performed prior to the initiation of the procedure. Initial ultrasound scanning demonstrates a large amount of ascites within the left lower abdominal quadrant. The left lower abdomen was prepped and draped in the usual sterile fashion. 1% lidocaine was used for local anesthesia. Following this, a 19 gauge, 7-cm, Yueh catheter was introduced. An ultrasound image was saved for documentation purposes. The paracentesis was performed. The catheter was removed and a dressing was applied. The patient tolerated the procedure well without immediate post procedural complication. FINDINGS: A total of approximately 2L of yellow clear fluid was  removed. Samples were sent to the  laboratory as requested by the clinical team. IMPRESSION: Successful ultrasound-guided paracentesis yielding 2L liters of peritoneal fluid. Read by: Durenda Guthrie, PA-C Electronically Signed   By: Miachel Roux M.D.   On: 09/28/2020 10:55    Labs: BMET Recent Labs  Lab 10/12/2020 1812 10/05/2020 1819 09/28/20 0344 09/28/20 1114 09/29/20 0220 09/29/20 0354  NA 131* 131* 130* 131* 133* 134*  K 3.6 3.7 4.1 4.0 4.2 4.3  CL 102 101 102 103  --  104  CO2 16*  --  14* 15*  --  14*  GLUCOSE 164* 156* 199* 186*  --  167*  BUN 76* 78* 77* 79*  --  88*  CREATININE 7.59* 7.90* 7.95* 7.86*  --  8.84*  CALCIUM 8.3*  --  8.1* 8.1*  --  8.6*  PHOS  --   --   --   --   --  10.0*   CBC Recent Labs  Lab 10/17/2020 1812 10/17/2020 1819 09/28/20 0344 09/28/20 1114 09/29/20 0220 09/29/20 0354  WBC 6.5  --  8.1 7.6  --  9.1  NEUTROABS 4.4  --  6.0 6.0  --   --   HGB 8.5*   < > 8.6* 8.4* 7.5* 8.8*  HCT 25.5*   < > 25.4* 26.3* 22.0* 25.3*  MCV 96.2  --  96.9 98.1  --  97.7  PLT 63*  --  71* 69*  --  79*   < > = values in this interval not displayed.    Medications:    . atropine      . midodrine  10 mg Oral TID WC  . octreotide  200 mcg Subcutaneous TID  . pantoprazole  40 mg Oral Daily  . phytonadione  10 mg Oral Daily  . rifaximin  550 mg Oral BID  . vancomycin variable dose per unstable renal function (pharmacist dosing)   Does not apply See admin instructions     Gifford Shave, MD [place] Resident, PGY2 09/29/2020, 8:22 AM

## 2020-09-29 NOTE — ED Notes (Signed)
This RN called [facility] for pt's admit disposition to be changed from "progressive" to "ICU" per pt's order set. This RN was told: "Okay, I'll fix it now."

## 2020-09-29 NOTE — Progress Notes (Signed)
[facility]  Asked by health system administration to change documentation to reflect sepsis status because the patient doesn't meet more than 1 SIRS criteria.  The patient has a new dense infiltrate in the right lung with an associated cough, dyspnea, new hypoxemia, multi-organ failure, an increase in WBC from his baseline of > 100% (normal WBC is ~3.5-4, here it is twice that), intermediate tachypnea.  Despite not meeting standard SIRS criteria, based on my interpretation of the data I am highly confident this patient has septic shock from healthcare associated pneumonia.  I suspect his severe liver disease contributes to the unusual physiologic response.  Roselie Awkward, MD [facility] Pager: [phone] Cell: [phone] If no response, call [phone]

## 2020-09-29 NOTE — ED Notes (Signed)
Md paged. No call received yet.

## 2020-09-29 NOTE — ED Notes (Signed)
Md notified of pts hr sustaining in 26s- 51s.

## 2020-09-29 NOTE — ED Notes (Signed)
Paged internal medicine.

## 2020-09-29 NOTE — ED Notes (Signed)
[facility] called per mds order, notified of pt being at max order dose of levophed of 10 mcg/min.

## 2020-09-29 NOTE — Consult Note (Signed)
NAME:  Brad [name], MRN:  [id number], DOB:  1951-03-10, LOS: 2 ADMISSION DATE:  10/24/2020, CONSULTATION DATE:  09/29/2020 REFERRING MD:  Rosine Door MD, CHIEF COMPLAINT:  Decompensated cirrhosis, altered mental status  Brief History:  70 year old with cirrhosis secondary to [place], portal hypertension with recurrent ascites requiring paracentesis, variceal bleed Presenting with sepsis secondary to pneumonia, decompensated liver failure, renal failure, bradycardia Underwent paracentesis on 2/2.  PCCM consulted for worsening mental status  Past Medical History:    has a past medical history of Cirrhosis of liver not due to alcohol ([place]), DDD (degenerative disc disease), lumbar, Diabetes mellitus without complication ([place]), Gout, and Hypertension.  Significant Hospital Events:  2/1- Admit  Consults:   PCCM, nephrology, gastroenterology  Procedures:  Paracentesis 2/2-2 L fluid removal  Significant Diagnostic Tests:  CT head 2/1-no acute intracranial abnormality, stable atrophy and chronic small vessel ischemia   Micro Data:  Blood cultures 2/1 Urine culture 2/1 Ascites culture 2/2  Antimicrobials:  Vancomycin 2/1 >> Cefepime 2/1 >>  Interim History / Subjective:    Objective   Blood pressure (!) 106/47, pulse (!) 55, temperature 97.7 F (36.5 C), temperature source Axillary, resp. rate 12, height 5' 10"  (1.778 m), weight 89.4 kg, SpO2 96 %.        Intake/Output Summary (Last 24 hours) at 09/29/2020 0242 Last data filed at 09/28/2020 1944 Gross per 24 hour  Intake 1799.67 ml  Output 550 ml  Net 1249.67 ml   Filed Weights   10/17/2020 1804  Weight: 89.4 kg    Examination: Gen:      Chronically ill-appearing HEENT:  EOMI, icterus Neck:     No masses; no thyromegaly Lungs:    Clear to auscultation bilaterally; normal respiratory effort CV:         Regular rate and rhythm; no murmurs Abd:      Distended abdomen.  No tenderness, guarding or rigidity Ext:    No edema;  adequate peripheral perfusion Skin:      Warm and dry; no rash Neuro: Somnolent, arousable but does not follow commands  Resolved Hospital Problem list     Assessment & Plan:  Sepsis present on admission, pneumonia SBP ruled out Hypoxic respiratory failure with concern for airway protection ABGs reviewed, continue supplemental oxygen Continue antibiotics NT suctioning Transfer to ICU as he is at high risk for intubation  Decompensated cirrhosis secondary to [facility] Hepatic encephalopathy Continue lactulose.  He is having good bowel movements Previously evaluated by transplant at [facility].  [facility] has been contacted again yesterday to evaluate for inpatient transplant (care everywhere note).  This will need to be followed up in the a.m.  Acute renal failure.  Secondary to ATN versus hepatorenal syndrome Not a dialysis candidate per nephrology at current state.  Can consider CRRT as a bridge if he can get liver transplant. Continue octreotide, midodrine and albumin Start low dose levophed for HRS  Bradycardia. HR in 40s HR may respond to pressors. Telemetry monitoring Atropine 1 mg x 1  Goals of care Has poor prognosis if he is declined for liver transplant I called and discussed with brother and recommended DNR status.  He will discuss with rest of the family and wants full code for now. Palliative care consult  Best practice (evaluated daily)  Diet: NPO Pain/Anxiety/Delirium protocol (if indicated): NA VAP protocol (if indicated): NA DVT prophylaxis: SCDs GI prophylaxis: PPI Glucose control: Monitor Mobility: Bed Disposition:ICU  Goals of Care:  Last date of multidisciplinary goals of  care discussion:09/29/20 Family and staff present: Brother Summary of discussion: Full code.  Follow up goals of care discussion due: 10/06/20 Code Status: Full  Labs   CBC: Recent Labs  Lab 10/10/2020 1812 09/28/2020 1819 09/28/20 0344 09/28/20 1114 09/29/20 0220  WBC 6.5  --  8.1 7.6  --    NEUTROABS 4.4  --  6.0 6.0  --   HGB 8.5* 8.5* 8.6* 8.4* 7.5*  HCT 25.5* 25.0* 25.4* 26.3* 22.0*  MCV 96.2  --  96.9 98.1  --   PLT 63*  --  71* 69*  --     Basic Metabolic Panel: Recent Labs  Lab 10/24/2020 1812 10/09/2020 1819 09/28/20 0344 09/28/20 1114 09/28/20 1800 09/29/20 0220  NA 131* 131* 130* 131*  --  133*  K 3.6 3.7 4.1 4.0  --  4.2  CL 102 101 102 103  --   --   CO2 16*  --  14* 15*  --   --   GLUCOSE 164* 156* 199* 186*  --   --   BUN 76* 78* 77* 79*  --   --   CREATININE 7.59* 7.90* 7.95* 7.86*  --   --   CALCIUM 8.3*  --  8.1* 8.1*  --   --   MG  --   --   --   --  2.3  --    GFR: Estimated Creatinine Clearance: 10 mL/min (A) (by C-G formula based on SCr of 7.86 mg/dL (H)). Recent Labs  Lab 10/05/2020 1812 09/28/20 0344 09/28/20 1114  PROCALCITON  --  0.69  --   WBC 6.5 8.1 7.6  LATICACIDVEN 1.5  --   --     Liver Function Tests: Recent Labs  Lab 10/07/2020 1812 09/28/20 0344 09/28/20 1114  AST 35 39 35  ALT 27 29 28   ALKPHOS 72 70 67  BILITOT 1.9* 2.4* 2.2*  PROT 5.9* 5.8* 5.8*  ALBUMIN 3.3* 3.2* 3.2*   No results for input(s): LIPASE, AMYLASE in the last 168 hours. Recent Labs  Lab 10/15/2020 1812  AMMONIA 73*    ABG    Component Value Date/Time   PHART 7.285 (L) 09/29/2020 0220   PCO2ART 31.3 (L) 09/29/2020 0220   PO2ART 93 09/29/2020 0220   HCO3 15.0 (L) 09/29/2020 0220   TCO2 16 (L) 09/29/2020 0220   ACIDBASEDEF 11.0 (H) 09/29/2020 0220   O2SAT 97.0 09/29/2020 0220     Coagulation Profile: Recent Labs  Lab 10/17/2020 1812 09/28/20 1114  INR 1.6* 1.7*    Cardiac Enzymes: Recent Labs  Lab 10/02/2020 1836  CKTOTAL 81    HbA1C: Hgb A1c MFr Bld  Date/Time Value Ref Range Status  08/21/2020 11:54 AM 5.6 4.8 - 5.6 % Final    Comment:    (NOTE) Pre diabetes:          5.7%-6.4%  Diabetes:              >6.4%  Glycemic control for   <7.0% adults with diabetes     CBG: Recent Labs  Lab 09/28/20 0755  GLUCAP 174*     Review of Systems:    Unable to obtain due to encephalopathy  Past Medical History:  He,  has a past medical history of Cirrhosis of liver not due to alcohol ([place]), DDD (degenerative disc disease), lumbar, Diabetes mellitus without complication ([place]), Gout, and Hypertension.   Surgical History:   Past Surgical History:  Procedure Laterality Date  . ADENOIDECTOMY    .  ANKLE SURGERY    . CHOLECYSTECTOMY    . IR PARACENTESIS  08/22/2020  . IR PARACENTESIS  09/19/2020  . IR PARACENTESIS  09/28/2020  . IR RADIOLOGIST EVAL & MGMT  02/10/2019  . LEFT HEART CATH AND CORONARY ANGIOGRAPHY N/A 07/29/2017   Procedure: LEFT HEART CATH AND CORONARY ANGIOGRAPHY;  Surgeon: Lorretta Harp, MD;  Location: [facility];  Service: Cardiovascular;  Laterality: N/A;  . TONSILLECTOMY       Social History:   reports that he has quit smoking. He has never used smokeless tobacco. He reports that he does not drink alcohol and does not use drugs.   Family History:  His family history is not on file.   Allergies Allergies  Allergen Reactions  . Lisinopril Swelling  . Penicillins Swelling    Has patient had a PCN reaction causing immediate rash, facial/tongue/throat swelling, SOB or lightheadedness with hypotension: No Has patient had a PCN reaction causing severe rash involving mucus membranes or skin necrosis: No Has patient had a PCN reaction that required hospitalization: Unknown Has patient had a PCN reaction occurring within the last 10 years: No If all of the above answers are "NO", then may proceed with Cephalosporin use.      Home Medications  Prior to Admission medications   Medication Sig Start Date End Date Taking? Authorizing Provider  allopurinol (ZYLOPRIM) 100 MG tablet Take 1 tablet (100 mg total) by mouth daily. Hold for 1 week to allow kidney function to improve 09/22/20  Yes Andrew Au, MD  CALCIUM PO Take 1 tablet by mouth daily.   Yes [provider]   cetirizine (ZYRTEC) 10 MG tablet Take 2 tablets (20 mg total) by mouth daily. 09/22/20  Yes Andrew Au, MD  cholecalciferol (VITAMIN D) 25 MCG (1000 UNIT) tablet Take 1 tablet (1,000 Units total) by mouth daily. 09/22/20  Yes Andrew Au, MD  empagliflozin (JARDIANCE) 10 MG TABS tablet Take 1 tablet (10 mg total) by mouth daily. 09/22/20  Yes Andrew Au, MD  escitalopram (LEXAPRO) 10 MG tablet Take 1 tablet (10 mg total) by mouth daily. 09/22/20  Yes Andrew Au, MD  furosemide (LASIX) 40 MG tablet Take 2 tablets (80 mg total) by mouth daily. Hold for 1 week to allow kidney function to improve Patient taking differently: Take 80 mg by mouth daily. 09/22/20  Yes Andrew Au, MD  gabapentin (NEURONTIN) 600 MG tablet Take 1 tablet (600 mg total) by mouth 2 (two) times daily. 09/22/20  Yes Andrew Au, MD  glipiZIDE (GLUCOTROL) 5 MG tablet Take 2 tablets (10 mg total) by mouth daily before breakfast. 09/22/20  Yes Andrew Au, MD  hydrOXYzine (ATARAX/VISTARIL) 25 MG tablet Take 1 tablet (25 mg total) by mouth at bedtime as needed. Patient taking differently: Take 25 mg by mouth at bedtime as needed for anxiety. 09/22/20  Yes Andrew Au, MD  insulin degludec (TRESIBA FLEXTOUCH) 100 UNIT/ML FlexTouch Pen Inject 40 Units into the skin daily.   Yes [provider]  lactulose (CHRONULAC) 10 GM/15ML solution Take 105 mLs (70 g total) by mouth See admin instructions. Mix 12 tbsp (120 g) in 1 pack cherry koolaid and 1 tbsp sugar/ divide into 4 doses and drink 4 times daily 09/22/20  Yes Andrew Au, MD  Omega-3 Fatty Acids (FISH OIL) 1000 MG CAPS Take 1 capsule (1,000 mg total) by mouth daily. 09/22/20  Yes Andrew Au, MD  ondansetron [facility])  4 MG tablet Take 4 mg by mouth every 8 (eight) hours as needed for nausea or vomiting. 09/14/20  Yes [provider]  Oxycodone HCl 10 MG TABS Take 10 mg by mouth 3 (three) times daily as needed for pain. 06/26/17  Yes [provider]  pantoprazole (PROTONIX) 40 MG tablet Take 1 tablet (40 mg total) by mouth daily. 09/22/20  Yes Andrew Au, MD  rifaximin (XIFAXAN) 550 MG TABS tablet Take 1 tablet (550 mg total) by mouth 2 (two) times daily. 09/22/20  Yes Andrew Au, MD  Semaglutide,0.25 or 0.5MG/DOS, (OZEMPIC, 0.25 OR 0.5 MG/DOSE,) 2 MG/1.5ML SOPN Inject 0.25 mg into the skin once a week. 08/29/20  Yes [provider]  solifenacin (VESICARE) 5 MG tablet Take 5 mg by mouth daily.   Yes [provider]  spironolactone (ALDACTONE) 100 MG tablet Take 1 tablet (100 mg total) by mouth 2 (two) times daily. Hold for 1 week to allow kidney function to improve 09/22/20  Yes Andrew Au, MD     Critical care time:    The patient is critically ill with multiple organ system failure and requires high complexity decision making for assessment and support, frequent evaluation and titration of therapies, advanced monitoring, review of radiographic studies and interpretation of complex data.   Critical Care Time devoted to patient care services, exclusive of separately billable procedures, described in this note is 45 minutes.   Marshell Garfinkel MD [facility] See Amion for pager  If no response to pager , please call [phone] until 7pm After 7:00 pm call Elink  [phone] 09/29/2020, 3:08 AM

## 2020-09-29 NOTE — Progress Notes (Signed)
NAME:  Brad [name], MRN:  [id number], DOB:  [birth year], LOS: 2 ADMISSION DATE:  10/12/2020, CONSULTATION DATE:  2/3 REFERRING MD:  [place], CHIEF COMPLAINT:  Confusion   Brief History:  70 y/o male with advanced cirrhosis presented to the ER on 2/1 with sepsis, decompensated liver failure, renal failure and bradycardia who was admitted by the teaching service but his mental status and hemodynamic status declined after admission so PCCM was consulted. Noted to have pneumonia.  Past Medical History:  Cirrhosis with ascites, variceal bleeding history Degenerative disc disease, lumbar spine DM2 Gout Hypertension  Significant Hospital Events:  2/1 admission 2/2 paracentesis 2/3 early AM PCCM consult hypotension and shock  Consults:  PCCM  Procedures:    Significant Diagnostic Tests:  2/1 CT head > NAICP, stable atrophy, chronic small vessel ischemia, scattered opacification of left mastoid air cells 2/2 abdominal ultrasound > small volume ascites in all four quadrants of abdomen 2/2 peritoneal fluid > WBC  Micro Data:  2/1 sars cov 2 > neg 2/1 blood >  2/2 peritoneal fluid >   Antimicrobials:  2/1 cefepime >  2/2 vanc >   Interim History / Subjective:   Gurgling on his own secretions Bradycardic   Objective   Blood pressure (!) 115/58, pulse 65, temperature 97.7 F (36.5 C), temperature source Axillary, resp. rate 14, height 5' 10"  (1.778 m), weight 89.4 kg, SpO2 97 %.        Intake/Output Summary (Last 24 hours) at 09/29/2020 0809 Last data filed at 09/28/2020 1944 Gross per 24 hour  Intake 1499.67 ml  Output 50 ml  Net 1449.67 ml   Filed Weights   10/18/2020 1804  Weight: 89.4 kg    Examination: General:  Chronically ill appearing male obtunded in bed HENT: NCAT OP clear, mm dry PULM: Rhonchi, upper airway gurgling B, normal effort CV: bradycardic, no mgr GI: BS+, soft, nontender MSK: normal bulk and tone Neuro: will open eyes to voice and light touch  but makes no pursposeful movements, only keeps eyes open for a few seconds, non-verbal   Resolved Hospital Problem list     Assessment & Plan:  Acute hepatic encephalopathy Acute decompensated cirrhosis with ascites, due to NASH Septic shock due to health care associated pneumonia AKI due to Hepato renal syndrome Obesity Hypertension Gout DDD Bradycardia (all the above were present on admission)  Discussion: He will not survive this illness.  Even if we elected to perform the most aggressive means of life support we are capable of here in our facility (mechanical ventilation, vasopressor support and CRRT) he would not be eligible for a liver transplant which would be the definitive solution to his underlying problem.  I discussed this with his brother [name] at length who feels that Oliverio would not want to be put on life support.  Code status is now DNR  -admit to ICU -continue levophed to maintain MAP > 65 -continue antibiotics for now -continue octreotide for now -continue rifaximin for now -continue midodrine for now -we will not perform hemodialysis -we will not put him on life support -we will not escalate care further -we will discuss withdrawal of care and comfort measures when the patient's family arrive  Palliative care was consulted overnight  CODE STATUS DNR  Best practice (evaluated daily)  Diet: npo Pain/Anxiety/Delirium protocol (if indicated): n/a VAP protocol (if indicated): n/a DVT prophylaxis: scd GI prophylaxis: n/a Glucose control: monitor Mobility: bed rest Disposition: to ICU  Goals of Care:  Last date  of multidisciplinary goals of care discussion:2/3 Family and staff present: [place] and brother [name] Summary of discussion: see above Follow up goals of care discussion due: n/a Code Status: DNR  Labs   CBC: Recent Labs  Lab 10/24/2020 1812 10/04/2020 1819 09/28/20 0344 09/28/20 1114 09/29/20 0220 09/29/20 0354  WBC 6.5  --  8.1 7.6   --  9.1  NEUTROABS 4.4  --  6.0 6.0  --   --   HGB 8.5* 8.5* 8.6* 8.4* 7.5* 8.8*  HCT 25.5* 25.0* 25.4* 26.3* 22.0* 25.3*  MCV 96.2  --  96.9 98.1  --  97.7  PLT 63*  --  71* 69*  --  79*    Basic Metabolic Panel: Recent Labs  Lab 09/28/2020 1812 10/20/2020 1819 09/28/20 0344 09/28/20 1114 09/28/20 1800 09/29/20 0220 09/29/20 0354  NA 131* 131* 130* 131*  --  133* 134*  K 3.6 3.7 4.1 4.0  --  4.2 4.3  CL 102 101 102 103  --   --  104  CO2 16*  --  14* 15*  --   --  14*  GLUCOSE 164* 156* 199* 186*  --   --  167*  BUN 76* 78* 77* 79*  --   --  88*  CREATININE 7.59* 7.90* 7.95* 7.86*  --   --  8.84*  CALCIUM 8.3*  --  8.1* 8.1*  --   --  8.6*  MG  --   --   --   --  2.3  --  3.5*  PHOS  --   --   --   --   --   --  10.0*   GFR: Estimated Creatinine Clearance: 8.9 mL/min (A) (by C-G formula based on SCr of 8.84 mg/dL (H)). Recent Labs  Lab 10/17/2020 1812 09/28/20 0344 09/28/20 1114 09/29/20 0354  PROCALCITON  --  0.69  --   --   WBC 6.5 8.1 7.6 9.1  LATICACIDVEN 1.5  --   --  1.4    Liver Function Tests: Recent Labs  Lab 10/18/2020 1812 09/28/20 0344 09/28/20 1114 09/29/20 0354  AST 35 39 35 30  ALT 27 29 28 25   ALKPHOS 72 70 67 62  BILITOT 1.9* 2.4* 2.2* 2.3*  PROT 5.9* 5.8* 5.8* 6.1*  ALBUMIN 3.3* 3.2* 3.2* 3.8   No results for input(s): LIPASE, AMYLASE in the last 168 hours. Recent Labs  Lab 10/12/2020 1812  AMMONIA 73*    ABG    Component Value Date/Time   PHART 7.285 (L) 09/29/2020 0220   PCO2ART 31.3 (L) 09/29/2020 0220   PO2ART 93 09/29/2020 0220   HCO3 15.0 (L) 09/29/2020 0220   TCO2 16 (L) 09/29/2020 0220   ACIDBASEDEF 11.0 (H) 09/29/2020 0220   O2SAT 97.0 09/29/2020 0220     Coagulation Profile: Recent Labs  Lab 10/02/2020 1812 09/28/20 1114 09/29/20 0354  INR 1.6* 1.7* 1.7*    Cardiac Enzymes: Recent Labs  Lab 10/12/2020 1836  CKTOTAL 81    HbA1C: Hgb A1c MFr Bld  Date/Time Value Ref Range Status  08/21/2020 11:54 AM 5.6 4.8 -  5.6 % Final    Comment:    (NOTE) Pre diabetes:          5.7%-6.4%  Diabetes:              >6.4%  Glycemic control for   <7.0% adults with diabetes     CBG: Recent Labs  Lab 09/28/20 0755  GLUCAP 174*  Critical care time: 60 minutes     Roselie Awkward, MD [place] PCCM Pager: [phone] Cell: [phone] If no response, call [phone]

## 2020-09-29 NOTE — ED Notes (Signed)
Md paged. No phone call received.

## 2020-09-29 NOTE — Progress Notes (Addendum)
IMTS Progress Note:  Subjective:  Paged by RN due to increased confusion and worsening mentation. She states that she was told at sign out that he was oriented to self but is currently not oriented at all. He agrees he feels more tired and weak. She states he has had <25cc's urine output over the last 6+ hours with <30cc's total last shift. He has required increasing amounts of supplemental O2 and says yes to SOB. Also endorses weakness. Denies abdominal pain or any other pain.   Objective:  BP 106/51, RR ~15 breathes/min, SpO2 97% on 14L NRB, HR 53 bpm  General: Patient appears chronically ill and uncomfortable, in no acute distress Eyes: Worsening scleral icterus present bilaterally. No nystagmus. Tracks with eyes.  Respiratory: Patient has mild increased work of breathing with active productive cough and trouble clearing secretions. There are diffuse rhonchi bilaterally R > L.  Cardiovascular: Rate is bradycardic. Rhythm is regular. Heart sounds soft although no murmurs appreciated. Distal pulses 2+ in all four extremities.  Abdomen: There is significant distention with positive fluid wave. No tenderness to palpation or guarding.  GU: No CVA tenderness. No suprapubic TTP. Neurological: Patient is lethargic. Patient is not oriented to person, place, time, or situation. Significant asterixis present bilaterally.  Musculoskeletal: Strength is 4/5 in all four extremities.  Skin: Mild jaundice with excoriations over bilateral lower extremities.   Assessment / Plan:   # Sepsis likely 2/2 PNA  # Hypoxic Respiratory Failure, Worsening Patient required increase in O2 requirement from 6L yesterday morning to 15L NRB this evening. Continues to have coarse rhonchi bilaterally R > L and now has new trouble clearing oral secretions with mild productive cough and increased work of breathing. May be secondary to PNA vs. Interstitial edema  - Will check ABG  - Continue broad spectrum ABX  - Will call  PCCM   # Decompensated Cirrhosis 2/2 NASH Patient is s/p 2L paracentesis yesterday and continues to have abdominal distention with positive fluid wave. Has had 3+ watery stools (w/ incontinence) over the past 6 hours although continues to have significant asterixis with worsening mentation, no longer oriented to person or place, time, situation.   - Will call PCCM; may require intubation - Discontinued lactulose 30g QID for now; will need to be restarted at lower dose   # Oliguria in setting of Acute Renal Failure  Patient has had < 60cc's over the last 16+ hours. Most likely due to hepatorenal syndrome although may also have component of ATN given low pressures on admission.   - continue to monitor with foley   # Bradycardia, worsening with ectopic beats Continues to have bradycardia that has been persistent since admission, although with rates as low as the 30's.   - Will repeat EKG given worsening mentation   Jeralyn Bennett, MD 09/29/2020, 2:00 AM Pager: [phone]

## 2020-09-29 NOTE — Progress Notes (Signed)
[facility] PCCM  Family updated at bedside  Full comfort measures  Stop levophed  Morphine prn at this point, may need drip  Roselie Awkward, MD [place] [facility] Pager: [phone] Cell: [phone] If no response, call [phone]

## 2020-09-29 NOTE — ED Notes (Signed)
Md made aware of pt hr at 25, md at bedside.

## 2020-09-29 NOTE — Progress Notes (Signed)
Pt. NT suction at this time per MD. Moderate amount of bloody/tan thick secretions noted. RT will continue to monitor.

## 2020-09-29 NOTE — ED Notes (Signed)
Spoke to internal medicine, regarding pts sinus bradycardia, abnormal ecg changes,  oliguria, and altered condition. Md is going to come down to check on pt.

## 2020-09-29 NOTE — ED Notes (Signed)
Called Elink, md notified of need to change admitting order to ICU.

## 2020-09-30 MED ORDER — MORPHINE 100MG IN NS 100ML (1MG/ML) PREMIX INFUSION
1.0000 mg/h | INTRAVENOUS | Status: DC
  Administered 2020-09-30: 1 mg/h via INTRAVENOUS
  Filled 2020-09-30: qty 100

## 2020-10-02 LAB — CULTURE, BLOOD (ROUTINE X 2)
Culture: NO GROWTH
Culture: NO GROWTH
Special Requests: ADEQUATE

## 2020-10-03 LAB — CULTURE, BODY FLUID W GRAM STAIN -BOTTLE: Culture: NO GROWTH

## 2020-10-25 NOTE — Progress Notes (Signed)
RN notified patients brother, Adilson Grafton that pt is deceased.

## 2020-10-25 NOTE — Progress Notes (Signed)
[facility] notified of patient's passing. Referral # [id number]. Potential for tissue and eyes. Spoke with bedside RN Judson Roch and requested she do an eye prep.

## 2020-10-25 NOTE — Death Summary Note (Signed)
DEATH SUMMARY   Patient Details  Name: Brad [name] MRN: [id number] DOB: [birth year]  Admission/Discharge Information   Admit Date:  Sep 29, 2020  Date of Death: Date of Death: 2020-10-02  Time of Death: Time of Death: 1200/10/23  Length of Stay: 3  Referring Physician: Glendon Axe, MD   Reason(s) for Hospitalization  decompensated cirrhosis  Diagnoses  Preliminary cause of death: septic shock due to pneumonia Secondary Diagnoses (including complications and co-morbidities):  Active Problems:   Cirrhosis, non-alcoholic ([place])   Sepsis due to pneumonia ([place])   Sepsis [facility])   Brief Hospital Course (including significant findings, care, treatment, and services provided and events leading to death)  Brad [name] is a 70 y.o. year old male who was admitted with decompensated cirrhosis. His mental status and oxygenation worsened. CXR with infiltrate. Developed hypotension with renal failure. Arnold with brother resulted in decision for comfort care. He passed away 2/4 peacefully.    Pertinent Labs and Studies  Significant Diagnostic Studies CT Head Wo Contrast  Result Date: 09/29/20 CLINICAL DATA:  Mental status change. EXAM: CT HEAD WITHOUT CONTRAST TECHNIQUE: Contiguous axial images were obtained from the base of the skull through the vertex without intravenous contrast. COMPARISON:  Head CT 08/21/2020 FINDINGS: Brain: Stable area of atrophy and chronic small vessel ischemia. No intracranial hemorrhage, mass effect, or midline shift. No hydrocephalus. The basilar cisterns are patent. No evidence of territorial infarct or acute ischemia. No extra-axial or intracranial fluid collection. Vascular: Atherosclerosis of skullbase vasculature without hyperdense vessel or abnormal calcification. Skull: No fracture or focal lesion. Sinuses/Orbits: Scattered opacification of left mastoid air cells, new from prior exam. Occasional mucosal thickening of ethmoid air cells. No sinus fluid levels. Orbits are  unremarkable. Other: Non fusion posterior arch of C1 is incidental. IMPRESSION: 1. No acute intracranial abnormality. 2. Stable atrophy and chronic small vessel ischemia. 3. Scattered opacification of left mastoid air cells, new from prior exam. Electronically Signed   By: Keith Rake M.D.   On: 09/29/2020 19:49   US RENAL  Result Date: 09/18/2020 CLINICAL DATA:  Bladder obstruction EXAM: RENAL / URINARY TRACT ULTRASOUND COMPLETE COMPARISON:  Renal ultrasound August 22, 2020 FINDINGS: Right Kidney: Renal measurements: 11.2 x 4.8 x 5.9 cm = volume: 163.5 mL. Echogenicity within normal limits. No mass or hydronephrosis visualized. Left Kidney: Renal measurements: 11.8 x 4.8 x 6.7 cm = volume: 200.1 mL. Echogenicity within normal limits. No mass or hydronephrosis visualized. Bladder: Not visualized. Other: There is moderate to large volume ascites in the abdomen and pelvis. IMPRESSION: Normal kidneys.  Bladder is not visualized. Moderate to large volume ascites in the abdomen and pelvis. Electronically Signed   By: Abelardo Diesel M.D.   On: 09/18/2020 12:54   US Abdomen Limited  Result Date: 09/28/2020 CLINICAL DATA:  Evaluate for ascites. EXAM: LIMITED ABDOMEN ULTRASOUND FOR ASCITES TECHNIQUE: Limited ultrasound survey for ascites was performed in all four abdominal quadrants. COMPARISON:  08/21/2020 FINDINGS: Four quadrant ultrasound of the abdomen reveals a small volume of ascites within all 4 quadrants. IMPRESSION: 1. Small volume of ascites noted within all 4 quadrants of the abdomen Electronically Signed   By: Kerby Moors M.D.   On: 09/28/2020 18:44   DG Chest Port 1 View  Result Date: 09-29-2020 CLINICAL DATA:  Altered mental status.  Questionable sepsis EXAM: PORTABLE CHEST 1 VIEW COMPARISON:  08/21/2020 FINDINGS: Two frontal portable radiographs. Midline trachea. Cardiomegaly accentuated by AP portable technique. Apparent right paratracheal soft tissue fullness is favored to be  technique  related. Pulmonary interstitial prominence and indistinctness. Right greater than left airspace disease with relative sparing of the left upper lung. IMPRESSION: Right greater than left airspace disease, highly suspicious for pneumonia. Cardiomegaly with underlying interstitial thickening and indistinctness, likely due to a component of congestive heart failure. Aortic Atherosclerosis (ICD10-I70.0). Electronically Signed   By: Abigail Miyamoto M.D.   On: 10/09/2020 19:01   IR Paracentesis  Result Date: 09/28/2020 INDICATION: Patient with NASH cirrhosis with recurrent ascites, presented to IR for paracentesis EXAM: ULTRASOUND GUIDED diagnostic and therapeutic  PARACENTESIS MEDICATIONS: N10 ccnlidocaine 1 %e. COMPLICATIONS: None immediate. PROCEDURE: Informed written consent was obtained from the patient's brother after a discussion of the risks, benefits and alternatives to treatment. A timeout was performed prior to the initiation of the procedure. Initial ultrasound scanning demonstrates a large amount of ascites within the left lower abdominal quadrant. The left lower abdomen was prepped and draped in the usual sterile fashion. 1% lidocaine was used for local anesthesia. Following this, a 19 gauge, 7-cm, Yueh catheter was introduced. An ultrasound image was saved for documentation purposes. The paracentesis was performed. The catheter was removed and a dressing was applied. The patient tolerated the procedure well without immediate post procedural complication. FINDINGS: A total of approximately 2L of yellow clear fluid was removed. Samples were sent to the laboratory as requested by the clinical team. IMPRESSION: Successful ultrasound-guided paracentesis yielding 2L liters of peritoneal fluid. Read by: Durenda Guthrie, PA-C Electronically Signed   By: Miachel Roux M.D.   On: 09/28/2020 10:55   IR Paracentesis  Result Date: 09/19/2020 INDICATION: History of cirrhosis with recurrent ascites. Request for diagnostic and  therapeutic paracentesis. EXAM: ULTRASOUND GUIDED RIGHT LOWER QUADRANT PARACENTESIS MEDICATIONS: 1% plain lidocaine, 5 mL COMPLICATIONS: None immediate. PROCEDURE: Informed written consent was obtained from the patient after a discussion of the risks, benefits and alternatives to treatment. A timeout was performed prior to the initiation of the procedure. Initial ultrasound scanning demonstrates a large amount of ascites within the right lower abdominal quadrant. The right lower abdomen was prepped and draped in the usual sterile fashion. 1% lidocaine was used for local anesthesia. Following this, a 19 gauge, 7-cm, Yueh catheter was introduced. An ultrasound image was saved for documentation purposes. The paracentesis was performed. The catheter was removed and a dressing was applied. The patient tolerated the procedure well without immediate post procedural complication. FINDINGS: A total of approximately 5.5 L of clear yellow fluid was removed. Samples were sent to the laboratory as requested by the clinical team. IMPRESSION: Successful ultrasound-guided paracentesis yielding 5.5 liters of peritoneal fluid. Read by: Ascencion Dike PA-C Electronically Signed   By: Corrie Mckusick D.O.   On: 09/19/2020 16:51    Microbiology Recent Results (from the past 240 hour(s))  SARS Coronavirus 2 by RT PCR (hospital order, performed in [facility] hospital lab) Nasopharyngeal Nasopharyngeal Swab     Status: None   Collection Time: 10/15/2020  6:12 PM   Specimen: Nasopharyngeal Swab  Result Value Ref Range Status   SARS Coronavirus 2 NEGATIVE NEGATIVE Final    Comment: (NOTE) SARS-CoV-2 target nucleic acids are NOT DETECTED.  The SARS-CoV-2 RNA is generally detectable in upper and lower respiratory specimens during the acute phase of infection. The lowest concentration of SARS-CoV-2 viral copies this assay can detect is 250 copies / mL. A negative result does not preclude SARS-CoV-2 infection and should not be used  as the sole basis for treatment or other patient management decisions.  A negative result may occur with improper specimen collection / handling, submission of specimen other than nasopharyngeal swab, presence of viral mutation(s) within the areas targeted by this assay, and inadequate number of viral copies (<250 copies / mL). A negative result must be combined with clinical observations, patient history, and epidemiological information.  Fact Sheet for Patients:   StrictlyIdeas.no  Fact Sheet for Healthcare Providers: BankingDealers.co.za  This test is not yet approved or  cleared by the [place] FDA and has been authorized for detection and/or diagnosis of SARS-CoV-2 by FDA under an Emergency Use Authorization (EUA).  This EUA will remain in effect (meaning this test can be used) for the duration of the COVID-19 declaration under Section 564(b)(1) of the Act, 21 U.S.C. section 360bbb-3(b)(1), unless the authorization is terminated or revoked sooner.  Performed at [facility], [place] [address], [place], [place] [address]   Blood Culture (routine x 2)     Status: None (Preliminary result)   Collection Time: 10/21/2020  6:12 PM   Specimen: BLOOD LEFT HAND  Result Value Ref Range Status   Specimen Description BLOOD LEFT HAND  Final   Special Requests   Final    BOTTLES DRAWN AEROBIC AND ANAEROBIC Blood Culture adequate volume   Culture   Final    NO GROWTH 3 DAYS Performed at [facility], [place] [address], [place], [place] [address]    Report Status PENDING  Incomplete  Blood Culture (routine x 2)     Status: None (Preliminary result)   Collection Time: 10/08/2020  6:36 PM   Specimen: BLOOD RIGHT HAND  Result Value Ref Range Status   Specimen Description BLOOD RIGHT HAND  Final   Special Requests   Final    BOTTLES DRAWN AEROBIC AND ANAEROBIC Blood Culture results may not be optimal due to an inadequate volume of blood  received in culture bottles   Culture   Final    NO GROWTH 3 DAYS Performed at [facility], [place] [address], [place], [place] [address]    Report Status PENDING  Incomplete  Urine culture     Status: None   Collection Time: 10/06/2020  6:51 PM   Specimen: In/Out Cath Urine  Result Value Ref Range Status   Specimen Description IN/OUT CATH URINE  Final   Special Requests NONE  Final   Culture   Final    NO GROWTH Performed at [facility], [place] [address] [place], [place] [address]    Report Status 09/29/2020 FINAL  Final  Gram stain     Status: None   Collection Time: 09/28/20 10:08 AM   Specimen: PATH Cytology Peritoneal fluid  Result Value Ref Range Status   Specimen Description PERITONEAL FLUID  Final   Special Requests NONE  Final   Gram Stain   Final    WBC PRESENT,BOTH PMN AND MONONUCLEAR NO ORGANISMS SEEN CYTOSPIN SMEAR Performed at [facility], [address], [place], [place] [address]    Report Status 09/28/2020 FINAL  Final  Culture, body fluid-bottle     Status: None (Preliminary result)   Collection Time: 09/28/20 10:08 AM   Specimen: Peritoneal Washings  Result Value Ref Range Status   Specimen Description PERITONEAL FLUID  Final   Special Requests NONE  Final   Culture   Final    NO GROWTH 2 DAYS Performed at [place] [address] [place], [place] [address]    Report  Status PENDING  Incomplete  MRSA PCR Screening     Status: None   Collection Time: 09/28/20 11:16 AM   Specimen: Nasal Mucosa; Nasopharyngeal  Result Value Ref Range Status   MRSA by PCR NEGATIVE NEGATIVE Final    Comment:        The GeneXpert MRSA Assay (FDA approved for NASAL specimens only), is one component of a comprehensive MRSA colonization surveillance program. It is not intended to diagnose MRSA infection nor to guide or monitor treatment for MRSA infections. Performed at [facility], [place] [address] [place], [place] [address]      Lab Basic Metabolic Panel: Recent Labs  Lab 10/11/2020 1812 10/18/2020 1819 09/28/20 0344 09/28/20 1114 09/28/20 1800 09/29/20 0220 09/29/20 0354  NA 131* 131* 130* 131*  --  133* 134*  K 3.6 3.7 4.1 4.0  --  4.2 4.3  CL 102 101 102 103  --   --  104  CO2 16*  --  14* 15*  --   --  14*  GLUCOSE 164* 156* 199* 186*  --   --  167*  BUN 76* 78* 77* 79*  --   --  88*  CREATININE 7.59* 7.90* 7.95* 7.86*  --   --  8.84*  CALCIUM 8.3*  --  8.1* 8.1*  --   --  8.6*  MG  --   --   --   --  2.3  --  3.5*  PHOS  --   --   --   --   --   --  10.0*   Liver Function Tests: Recent Labs  Lab 10/21/2020 1812 09/28/20 0344 09/28/20 1114 09/29/20 0354  AST 35 39 35 30  ALT 27 29 28 25   ALKPHOS 72 70 67 62  BILITOT 1.9* 2.4* 2.2* 2.3*  PROT 5.9* 5.8* 5.8* 6.1*  ALBUMIN 3.3* 3.2* 3.2* 3.8   No results for input(s): LIPASE, AMYLASE in the last 168 hours. Recent Labs  Lab 10/06/2020 1812  AMMONIA 73*   CBC: Recent Labs  Lab 10/12/2020 1812 10/05/2020 1819 09/28/20 0344 09/28/20 1114 09/29/20 0220 09/29/20 0354  WBC 6.5  --  8.1 7.6  --  9.1  NEUTROABS 4.4  --  6.0 6.0  --   --   HGB 8.5* 8.5* 8.6* 8.4* 7.5* 8.8*  HCT 25.5* 25.0* 25.4* 26.3* 22.0* 25.3*  MCV 96.2  --  96.9 98.1  --  97.7  PLT 63*  --  71* 69*  --  79*   Cardiac Enzymes: Recent Labs  Lab 10/16/2020 1836  CKTOTAL 81   Sepsis Labs: Recent Labs  Lab 10/11/2020 1812 09/28/20 0344 09/28/20 1114 09/29/20 0354  PROCALCITON  --  0.69  --   --   WBC 6.5 8.1 7.6 9.1  LATICACIDVEN 1.5  --   --  1.4    Procedures/Operations  As per EMR   Rodman Key R Jazminn Pomales 15-Oct-2020, 2:06 PM

## 2020-10-25 NOTE — Progress Notes (Signed)
Called to room for asystole GCS3 Pupils unreactive No pulse, no heart sounds, no breath sounds Declared dead at 12:02 on 10-04-20. RN to inform family  Erskine Emery MD PCCM

## 2020-10-25 DEATH — deceased

## 2021-05-06 IMAGING — DX DG CHEST 1V PORT
2 series · 2 of 2 positions shown · non-contrast
Comparison: 08/21/2020

CLINICAL DATA: Altered mental status.  Questionable sepsis

EXAM:
PORTABLE CHEST 1 VIEW

[chest ap (1 of 2)]
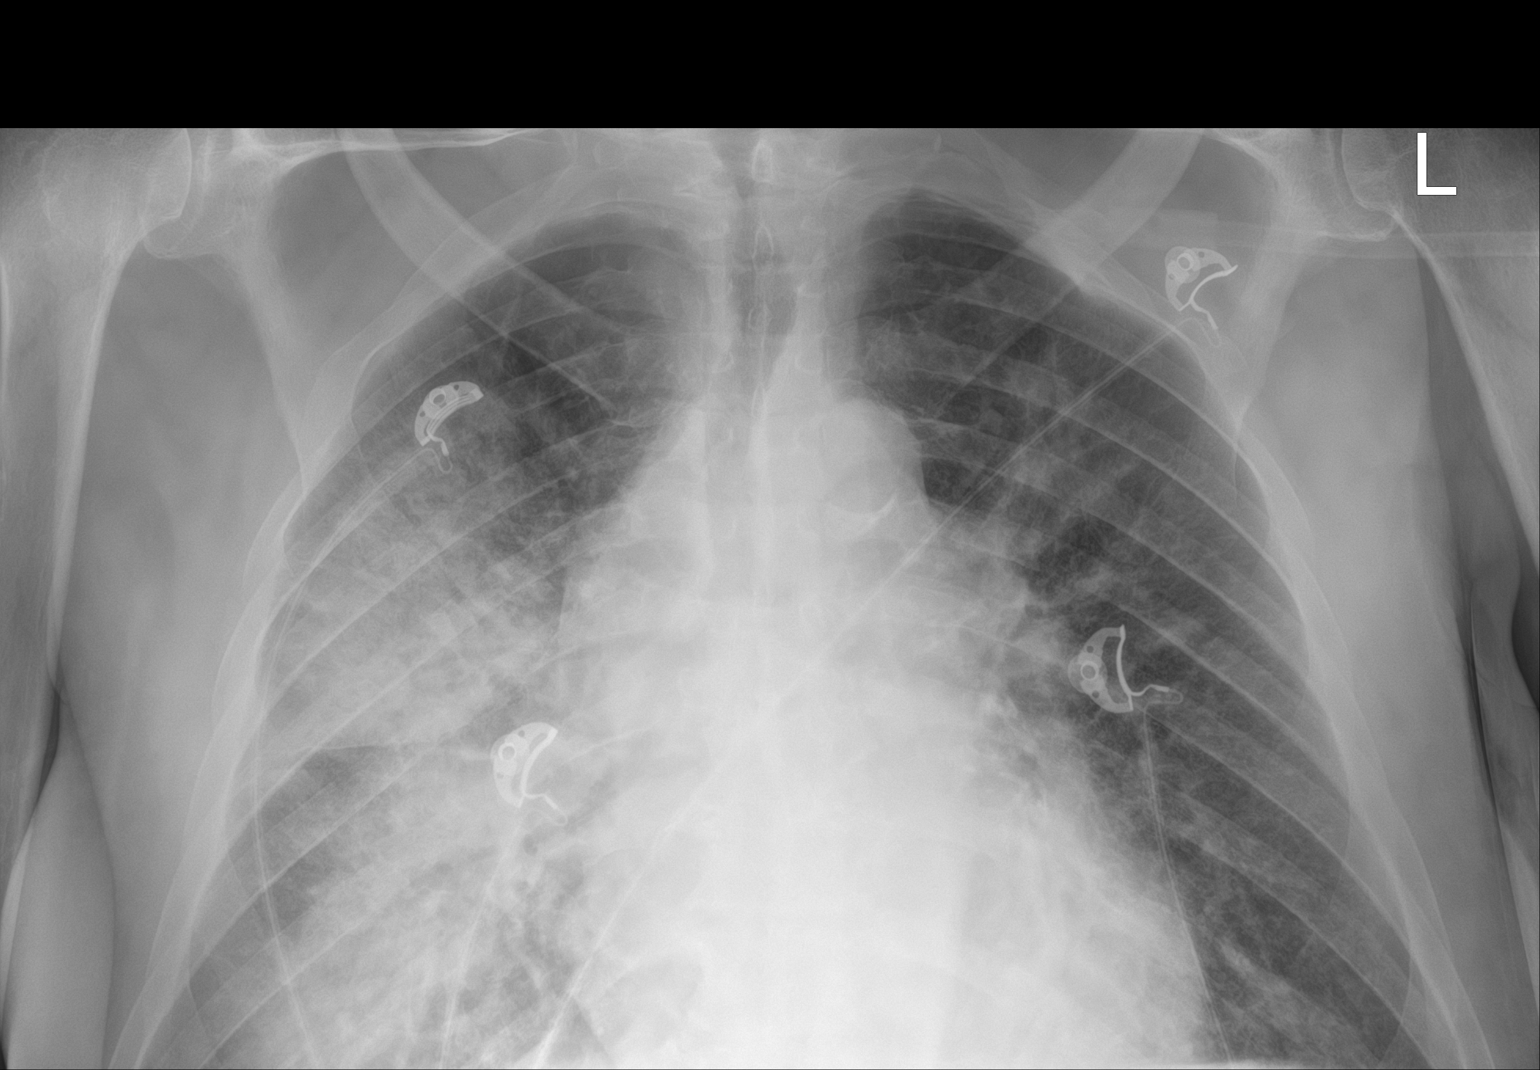

[chest ap (2 of 2)]
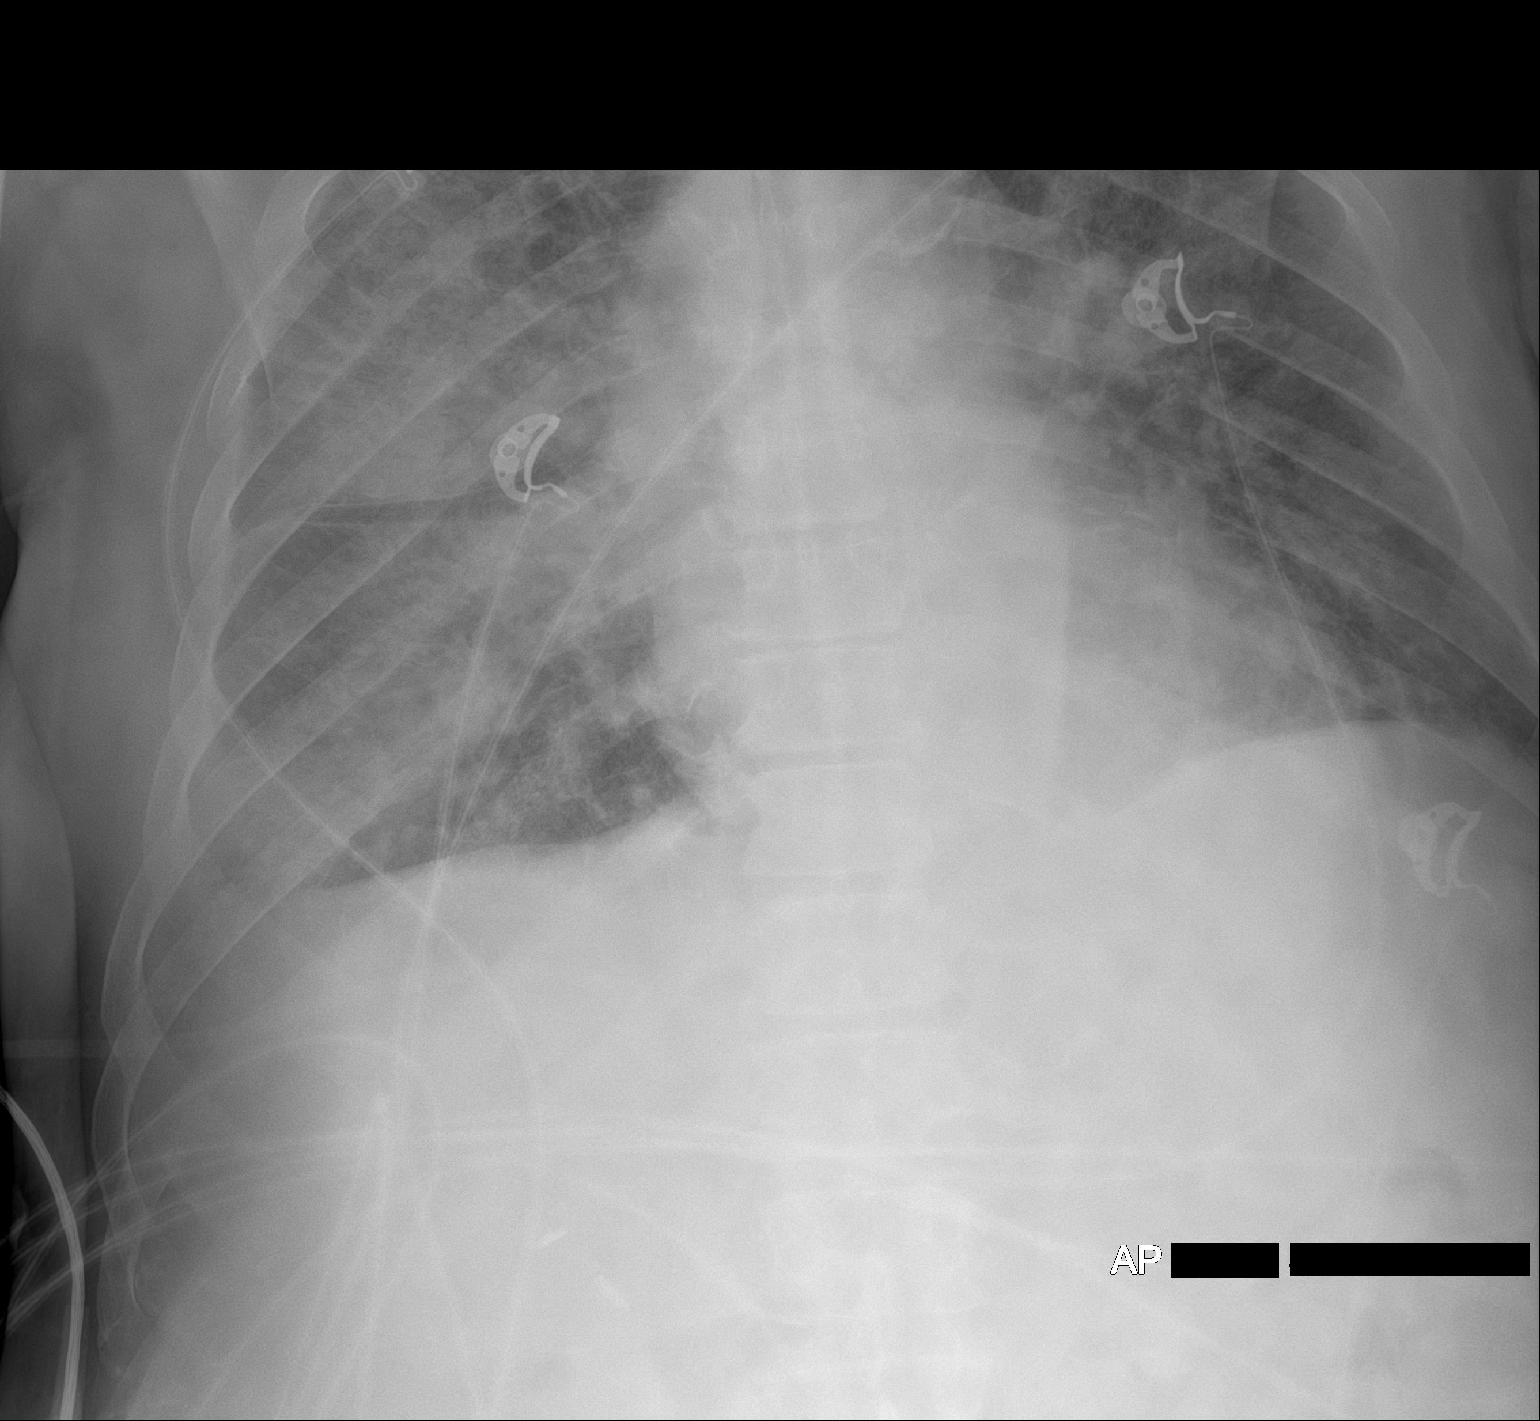

[2 of 2 positions shown; findings below may reference images not displayed]

FINDINGS: Two frontal portable radiographs. Midline trachea. Cardiomegaly
accentuated by AP portable technique. Apparent right paratracheal
soft tissue fullness is favored to be technique related.

Pulmonary interstitial prominence and indistinctness. Right greater
than left airspace disease with relative sparing of the left upper
lung.
IMPRESSION: Right greater than left airspace disease, highly suspicious for
pneumonia.

Cardiomegaly with underlying interstitial thickening and
indistinctness, likely due to a component of congestive heart
failure.

Aortic Atherosclerosis ([id number]).

## 2021-05-07 IMAGING — US US ABDOMEN LIMITED
1 series · 10 of 10 positions shown · non-contrast
Comparison: 08/21/2020

CLINICAL DATA: Evaluate for ascites.

EXAM:
LIMITED ABDOMEN ULTRASOUND FOR ASCITES
TECHNIQUE: Limited ultrasound survey for ascites was performed in all four
abdominal quadrants.

[Series 1: us abdomen limited · 10 of 10 slices shown]
[im 1/10]
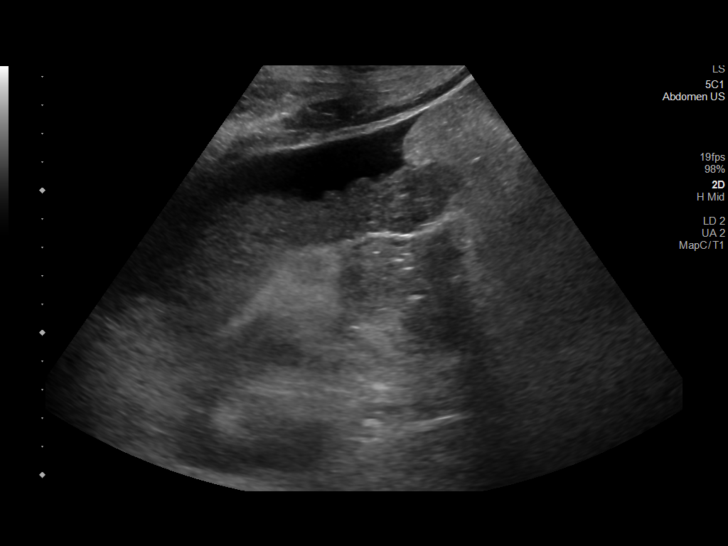
[im 2/10]
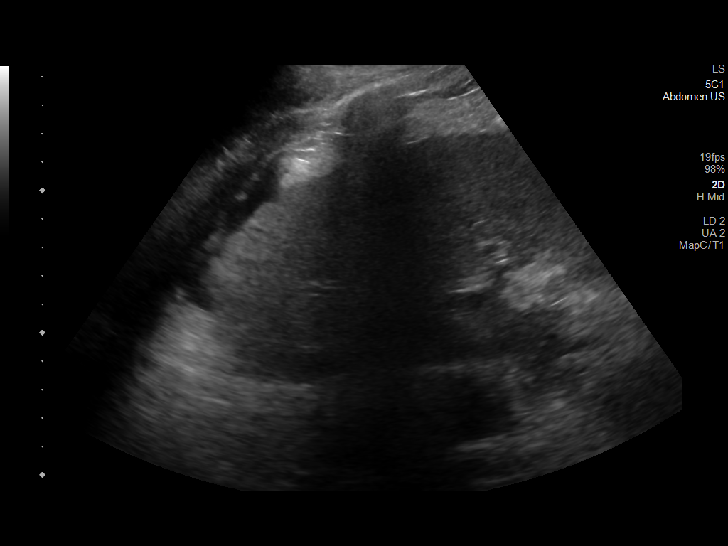
[im 3/10]
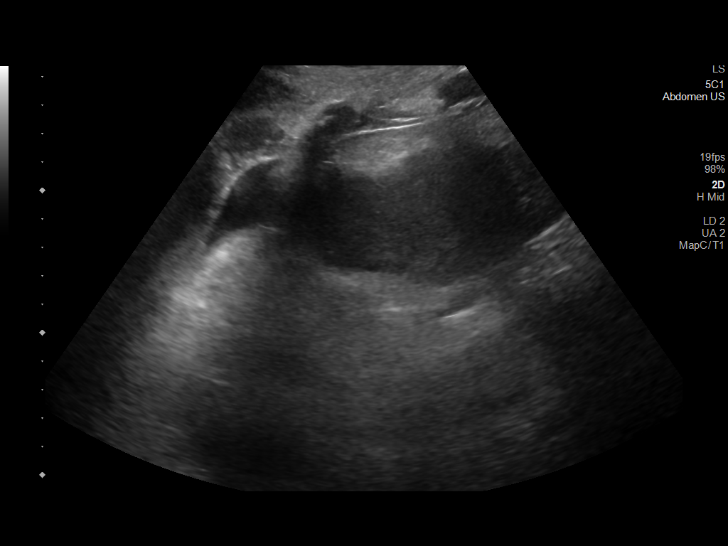
[im 4/10]
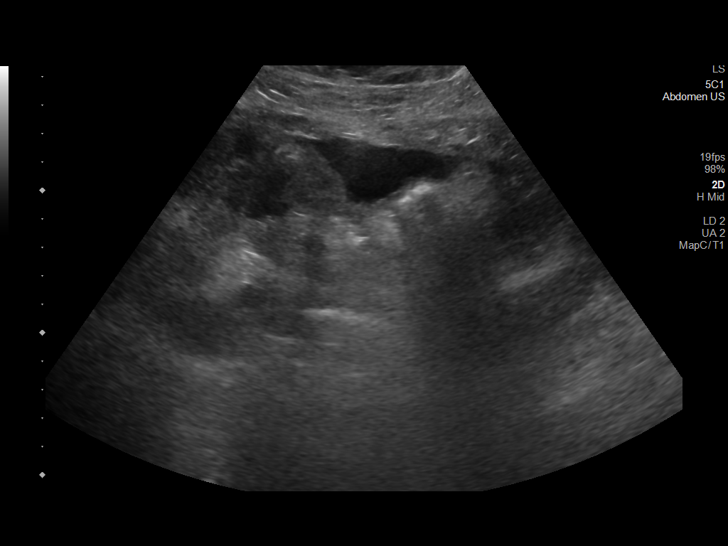
[im 5/10]
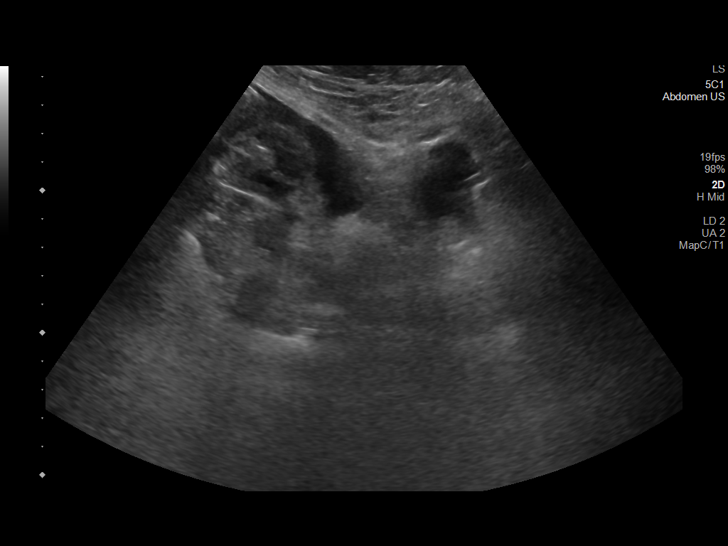
[im 6/10]
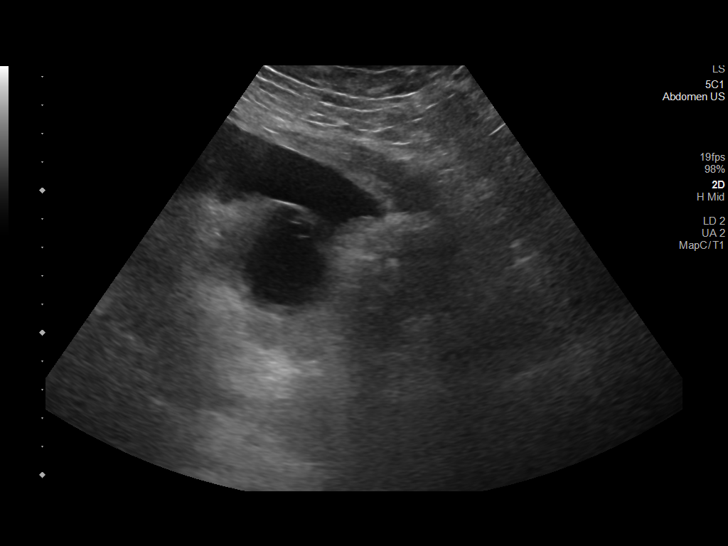
[im 7/10]
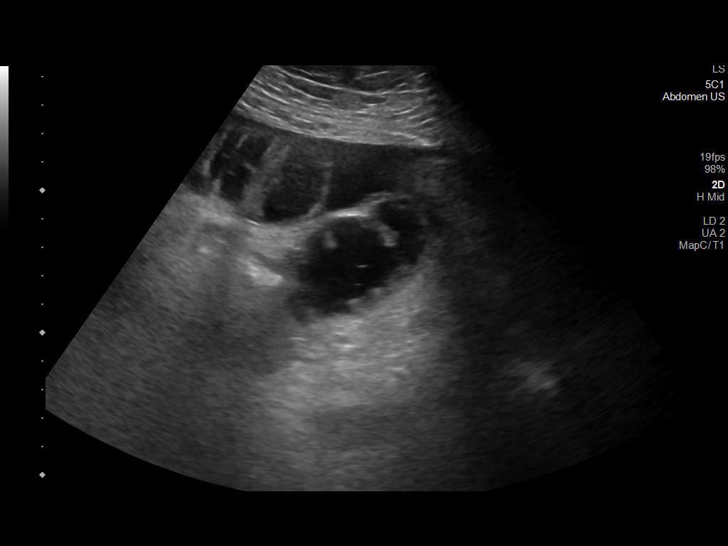
[im 8/10]
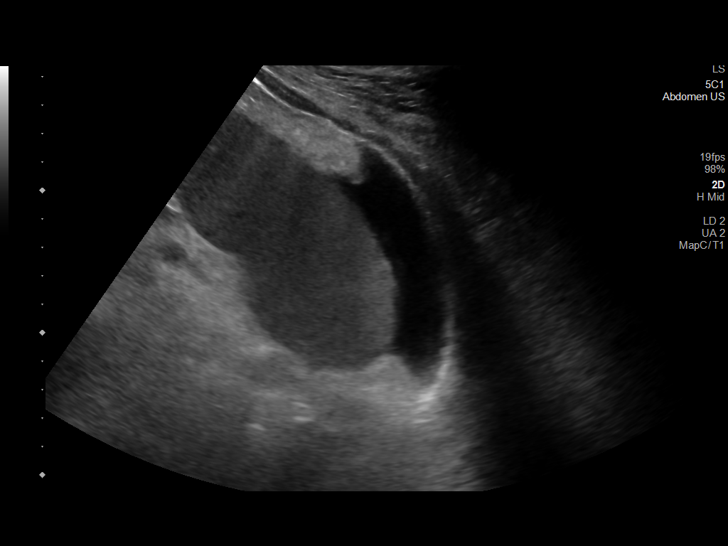
[im 9/10]
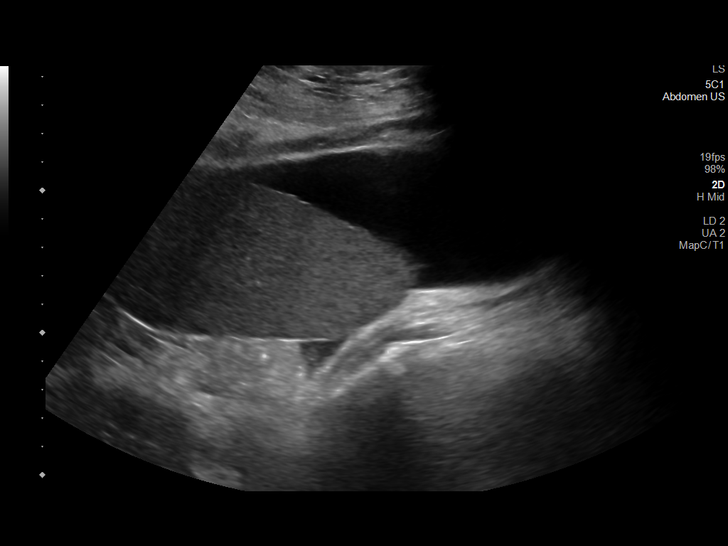
[im 10/10]
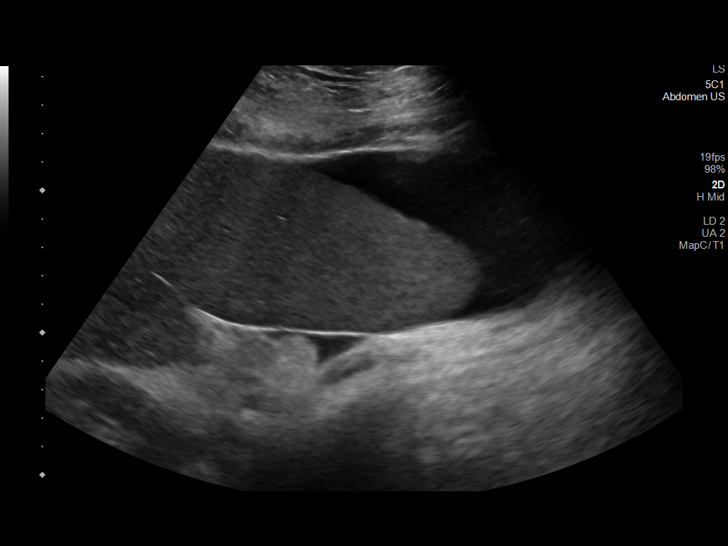

[10 of 10 positions shown; findings below may reference images not displayed]

FINDINGS: Four quadrant ultrasound of the abdomen reveals a small volume of
ascites within all 4 quadrants.
IMPRESSION: 1. Small volume of ascites noted within all 4 quadrants of the
abdomen

## 2021-05-07 IMAGING — US IR PARACENTESIS
1 series · 3 of 3 positions shown · non-contrast
Comparison: none

INDICATION: Patient with NASH cirrhosis with recurrent ascites, presented to IR
for paracentesis

[Series 1: ir (id) (id)/(id)/(id) ir · 3 of 3 slices shown]
[im 1/3]
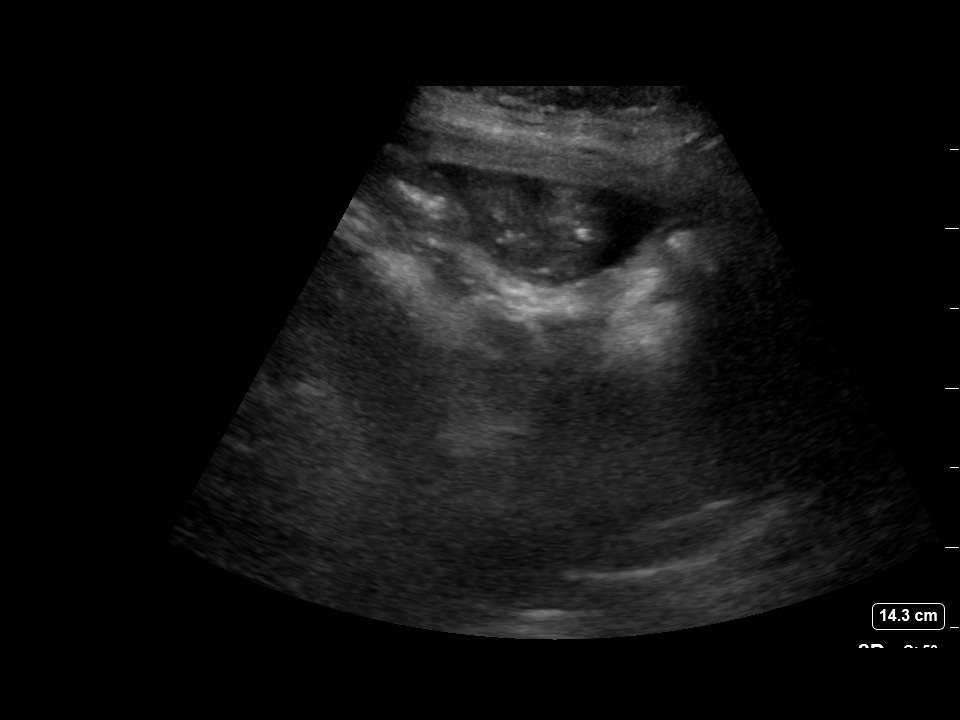
[im 2/3]
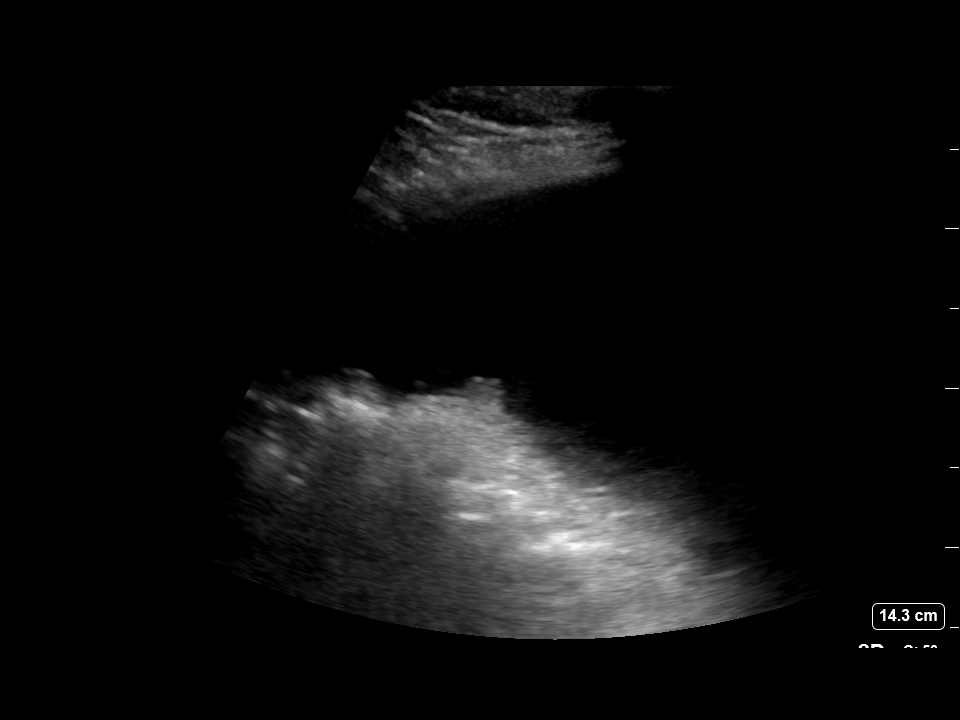
[im 3/3]
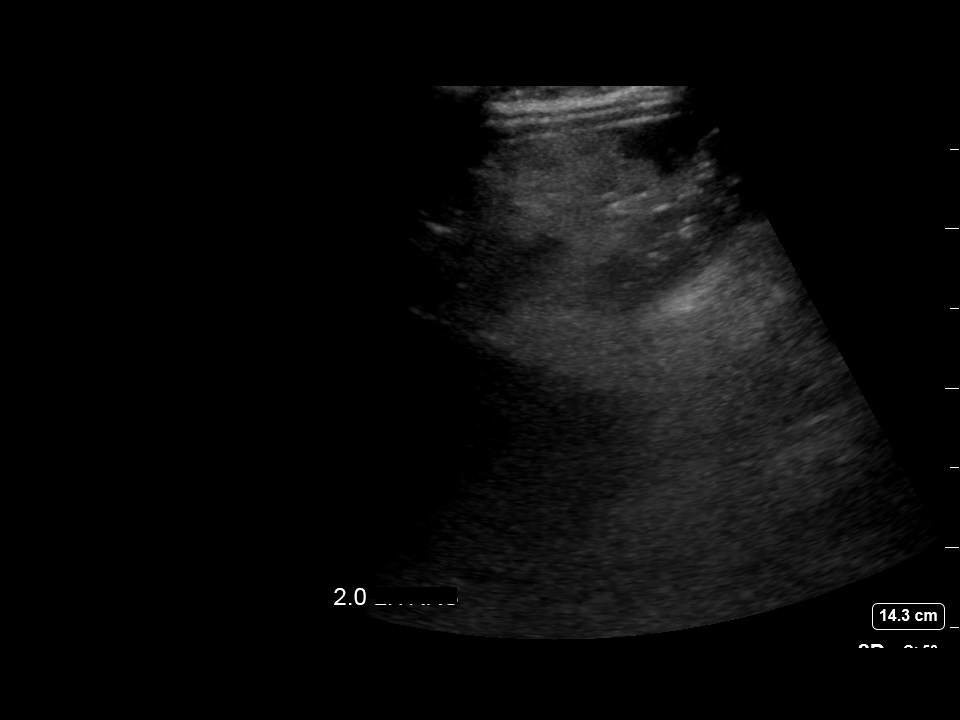

[3 of 3 positions shown; findings below may reference images not displayed]

EXAM:
ULTRASOUND GUIDED diagnostic and therapeutic  PARACENTESIS

MEDICATIONS:
N10 ccnlidocaine 1 %e.

COMPLICATIONS:
None immediate.

PROCEDURE:
Informed written consent was obtained from the patient's brother
after a discussion of the risks, benefits and alternatives to
treatment. A timeout was performed prior to the initiation of the
procedure.

Initial ultrasound scanning demonstrates a large amount of ascites
within the left lower abdominal quadrant. The left lower abdomen was
prepped and draped in the usual sterile fashion. 1% lidocaine was
used for local anesthesia.

Following this, a 19 gauge, 7-cm, Yueh catheter was introduced. An
ultrasound image was saved for documentation purposes. The
paracentesis was performed. The catheter was removed and a dressing
was applied. The patient tolerated the procedure well without
immediate post procedural complication.
FINDINGS: A total of approximately 2L of yellow clear fluid was removed.
Samples were sent to the laboratory as requested by the clinical
team.
IMPRESSION: Successful ultrasound-guided paracentesis yielding 2L liters of
peritoneal fluid.
